# Patient Record
Sex: Female | Born: 1980 | Race: White | Hispanic: No | Marital: Married | Smoking: Former smoker
Health system: Southern US, Community
[De-identification: ages and names within clinical notes are randomized; demographics above are authoritative.]

## PROBLEM LIST (undated history)

## (undated) ENCOUNTER — Inpatient Hospital Stay (HOSPITAL_COMMUNITY): Payer: Self-pay

## (undated) DIAGNOSIS — O24419 Gestational diabetes mellitus in pregnancy, unspecified control: Secondary | ICD-10-CM

## (undated) DIAGNOSIS — N200 Calculus of kidney: Secondary | ICD-10-CM

## (undated) DIAGNOSIS — B002 Herpesviral gingivostomatitis and pharyngotonsillitis: Secondary | ICD-10-CM

## (undated) DIAGNOSIS — K589 Irritable bowel syndrome without diarrhea: Secondary | ICD-10-CM

## (undated) HISTORY — DX: Irritable bowel syndrome, unspecified: K58.9

## (undated) HISTORY — PX: LITHOTRIPSY: SUR834

## (undated) HISTORY — DX: Gestational diabetes mellitus in pregnancy, unspecified control: O24.419

## (undated) HISTORY — DX: Herpesviral gingivostomatitis and pharyngotonsillitis: B00.2

## (undated) HISTORY — DX: Calculus of kidney: N20.0

## (undated) NOTE — *Deleted (*Deleted)
History     CSN: [id number]  Arrival date and time: 02/27/20 2878   First Provider Initiated Contact with Patient 02/27/20 1936      Chief Complaint  Patient presents with  . Contractions   HPI VELERA LANSDALE is a 88 y.o. G3P1011 at 37w5dwho presents to MAU with chief complaint of preterm contractions. This is a new problem. Patient states she met her mom for lunch, [facility] q [facility]-15 minutes for a period of about 3 hours. Patient states she "wouldn't even have come but my mom said I should".   She denies vaginal bleeding, leaking of fluid, decreased fetal movement, fever, falls, or recent illness.   [facility]    Gravida  3   Para  1   Term  1   Preterm      AB  1   Living  1     SAB  1   TAB      Ectopic      Multiple  0   Live Births  1           Past Medical History:  Diagnosis Date  . Family history of hypertrophic cardiomyopathy 06/07/2016  . Gestational diabetes    diet controlled  . H/O gestational diabetes in prior pregnancy, currently pregnant 08/21/2019   Early a1c negative  . IBS (irritable bowel syndrome)    Diarrhea type  . Kidney stones    none in 8 years  . Oral herpes simplex infection    Never had genital    Past Surgical History:  Procedure Laterality Date  . LITHOTRIPSY    . VAGINAL DELIVERY      Family History  Problem Relation Age of Onset  . Cancer Sister        sarcoma in the uterus  . Cancer Maternal Grandmother        lung  . Diabetes Maternal Grandmother   . Heart attack Maternal Grandfather   . Pneumonia Paternal Grandmother   . Diabetes Paternal Grandmother   . Cancer Paternal Grandfather        ?spine    Social History   Tobacco Use  . Smoking status: Former Smoker    Quit date: 07/15/2006    Years since quitting: 13.6  . Smokeless tobacco: Never Used   Vaping Use  . Vaping Use: Never used  Substance Use Topics  . Alcohol use: Not Currently    Alcohol/week: 0.0 standard drinks    Comment: 2-3 times per week  . Drug use: No    Allergies: No Known Allergies  Medications Prior to Admission  Medication Sig Dispense Refill Last Dose  . acyclovir (ZOVIRAX) 400 MG tablet One tab po tid x 5 days for fever blister. One tab po bid for suppression 60 tablet 3 02/27/2020 at Unknown time  . Ascorbic Acid (VITAMIN C) 100 MG tablet Take 100 mg by mouth daily.   02/27/2020 at Unknown time  . Cholecalciferol (VITAMIN D) 50 MCG (2000 UT) tablet Take 1 tablet (2,000 Units total) by mouth daily. 90 tablet 1 02/27/2020 at Unknown time  . Prenatal Vit-Fe Fumarate-FA (MULTIVITAMIN-PRENATAL) 27-0.8 MG TABS tablet Take 1 tablet by mouth daily at 12 noon.   02/27/2020 at Unknown time  . vitamin B-12 (CYANOCOBALAMIN) 500 MCG tablet Take 500 mcg by mouth daily.   02/27/2020 at Unknown time  .  aspirin EC 81 MG tablet Take 1 tablet (81 mg total) by mouth daily. Take after 12 weeks for prevention of preeclampsia later in pregnancy 300 tablet 2     Review of Systems  Gastrointestinal: Positive for abdominal pain.  Genitourinary: Negative for vaginal bleeding, vaginal discharge and vaginal pain.  Musculoskeletal: Negative for back pain.  All other systems reviewed and are negative.  Physical Exam   Blood pressure 124/77, pulse 76, temperature 98.5 F (36.9 C), resp. rate 18, height 5\' 3"  (1.6 m), weight 77.1 kg, last menstrual period 06/13/2019.  Physical Exam Vitals and nursing note reviewed. Exam conducted with a chaperone present.  Cardiovascular:     Rate and Rhythm: Normal rate.     Pulses: Normal pulses.  Pulmonary:     Effort: Pulmonary effort is normal.  Abdominal:     Comments: Gravid  Neurological:     Mental Status: She is alert.     MAU Course  Procedures  --Reactive tracing: baseline 130, mod var, + 15 x 15 accels, no decels --Toco:  mild ctx q 3-7 min --Cervix 1/thick/posterior, Vertex by suture --35w 5d, tocolysis not indicated. Pt declined offer of BMZ  --Plan to reevaluate cervix in 3 hours or earlier as indicated by change in status --Pain remains 1-2/10 one hour following initial assessment  Patient Vitals for the past 24 hrs:  BP Temp Pulse Resp Height Weight  02/27/20 1914 124/77 98.5 F (36.9 C) 76 18 5\' 3"  (1.6 m) 77.1 kg   Meds ordered this encounter  Medications  . acetaminophen (TYLENOL) tablet 1,000 mg  . cyclobenzaprine (FLEXERIL) tablet 10 mg  . lactated ringers bolus 1,000 mL   Report given to Sharen Counter, CNM who assumes care of patient at this time.  Clayton Bibles, MSN, CNM Certified Nurse Midwife, [facility] for [employer], [facility] 02/27/20 9:53 PM   Cervix rechecked after 3 hours in MAU: Dilation: 3 Effacement (%): 50 Cervical Position: Posterior Station: -2 Presentation: Vertex Exam by:: Sharen Counter, CNM  MDM:  Given cervical change from 1-3 cm in 3 hours MAU, offered and discussed BMZ with pt and her husband and BMZ ordered and given by RN.  Plan to recheck cervix in 2 additional hours. If unchanged, may discharge with preterm labor precautions but if more cervical change, will admit to L&D.      Assessment and Plan

---
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
## 2005-03-12 ENCOUNTER — Emergency Department (HOSPITAL_COMMUNITY): Admission: EM | Admit: 2005-03-12 | Discharge: 2005-03-12 | Payer: Self-pay | Admitting: Emergency Medicine

## 2013-05-08 NOTE — L&D Delivery Note (Signed)
Delivery Note At 7:23 AM a viable female was delivered via Vaginal, Spontaneous Delivery (Presentation: ; Occiput Anterior).  APGAR: 8, 9; weight pending .   Placenta status: Intact, Spontaneous.  Cord: 3 vessels with the following complications: None.  Cord pH: na  Anesthesia: Epidural  Episiotomy: None Lacerations:  1st degree Suture Repair: 3.0 vicryl Est. Blood Loss (mL):  400  Mom to postpartum.  Baby to Couplet care / Skin to Skin.  Aldona BarKoch, Bertran Zeimet L 03/15/2014, 7:41 AM

## 2013-07-14 ENCOUNTER — Ambulatory Visit (INDEPENDENT_AMBULATORY_CARE_PROVIDER_SITE_OTHER): Payer: 59 | Admitting: *Deleted

## 2013-07-14 ENCOUNTER — Encounter: Payer: Self-pay | Admitting: *Deleted

## 2013-07-14 VITALS — BP 122/82 | Ht 62.0 in | Wt 141.0 lb

## 2013-07-14 DIAGNOSIS — Z3491 Encounter for supervision of normal pregnancy, unspecified, first trimester: Secondary | ICD-10-CM

## 2013-07-14 DIAGNOSIS — Z3480 Encounter for supervision of other normal pregnancy, unspecified trimester: Secondary | ICD-10-CM

## 2013-07-14 NOTE — Progress Notes (Signed)
P-65.  Not sure of the LMP, dating was done from estimated date of conception.  Will follow up with U/S and labs.

## 2013-07-15 LAB — OBSTETRIC PANEL
Antibody Screen: NEGATIVE
Basophils Absolute: 0 10*3/uL (ref 0.0–0.1)
Basophils Relative: 0 % (ref 0–1)
Eosinophils Absolute: 0.1 10*3/uL (ref 0.0–0.7)
Eosinophils Relative: 1 % (ref 0–5)
HCT: 37.8 % (ref 36.0–46.0)
Hemoglobin: 12.7 g/dL (ref 12.0–15.0)
Hepatitis B Surface Ag: NEGATIVE
Lymphocytes Relative: 28 % (ref 12–46)
Lymphs Abs: 2.1 10*3/uL (ref 0.7–4.0)
MCH: 29.8 pg (ref 26.0–34.0)
MCHC: 33.6 g/dL (ref 30.0–36.0)
MCV: 88.7 fL (ref 78.0–100.0)
Monocytes Absolute: 0.7 10*3/uL (ref 0.1–1.0)
Monocytes Relative: 9 % (ref 3–12)
Neutro Abs: 4.7 10*3/uL (ref 1.7–7.7)
Neutrophils Relative %: 62 % (ref 43–77)
Platelets: 265 10*3/uL (ref 150–400)
RBC: 4.26 MIL/uL (ref 3.87–5.11)
RDW: 13.4 % (ref 11.5–15.5)
Rh Type: POSITIVE
Rubella: 1.23 Index — ABNORMAL HIGH (ref <0.90)
WBC: 7.6 10*3/uL (ref 4.0–10.5)

## 2013-07-15 LAB — HIV ANTIBODY (ROUTINE TESTING W REFLEX): HIV: NONREACTIVE

## 2013-07-15 LAB — HCG, QUANTITATIVE, PREGNANCY: hCG, Beta Chain, Quant, S: 391.7 m[IU]/mL

## 2013-07-16 LAB — CYSTIC FIBROSIS DIAGNOSTIC STUDY

## 2013-07-16 LAB — CULTURE, OB URINE
Colony Count: NO GROWTH
Organism ID, Bacteria: NO GROWTH

## 2013-08-01 ENCOUNTER — Encounter: Payer: 59 | Admitting: Family Medicine

## 2013-08-01 ENCOUNTER — Ambulatory Visit (INDEPENDENT_AMBULATORY_CARE_PROVIDER_SITE_OTHER): Payer: 59 | Admitting: Family Medicine

## 2013-08-01 ENCOUNTER — Encounter: Payer: Self-pay | Admitting: Family Medicine

## 2013-08-01 VITALS — BP 104/61 | Wt 143.0 lb

## 2013-08-01 DIAGNOSIS — N200 Calculus of kidney: Secondary | ICD-10-CM

## 2013-08-01 DIAGNOSIS — Z3400 Encounter for supervision of normal first pregnancy, unspecified trimester: Secondary | ICD-10-CM

## 2013-08-01 DIAGNOSIS — Z1151 Encounter for screening for human papillomavirus (HPV): Secondary | ICD-10-CM

## 2013-08-01 DIAGNOSIS — Z124 Encounter for screening for malignant neoplasm of cervix: Secondary | ICD-10-CM

## 2013-08-01 DIAGNOSIS — Z113 Encounter for screening for infections with a predominantly sexual mode of transmission: Secondary | ICD-10-CM

## 2013-08-01 DIAGNOSIS — O0990 Supervision of high risk pregnancy, unspecified, unspecified trimester: Secondary | ICD-10-CM | POA: Insufficient documentation

## 2013-08-01 NOTE — Patient Instructions (Signed)
Pregnancy - First Trimester During sexual intercourse, millions of sperm go into the vagina. Only 1 sperm will penetrate and fertilize the female egg while it is in the Fallopian tube. One week later, the fertilized egg implants into the wall of the uterus. An embryo begins to develop into a baby. At 6 to 8 weeks, the eyes and face are formed and the heartbeat can be seen on ultrasound. At the end of 12 weeks (first trimester), all the baby's organs are formed. Now that you are pregnant, you will want to do everything you can to have a healthy baby. Two of the most important things are to get good prenatal care and follow your caregiver's instructions. Prenatal care is all the medical care you receive before the baby's birth. It is given to prevent, find, and treat problems during the pregnancy and childbirth. PRENATAL EXAMS  During prenatal visits, your weight, blood pressure, and urine are checked. This is done to make sure you are healthy and progressing normally during the pregnancy.  A pregnant woman should gain 25 to 35 pounds during the pregnancy. However, if you are overweight or underweight, your caregiver will advise you regarding your weight.  Your caregiver will ask and answer questions for you.  Blood work, cervical cultures, other necessary tests, and a Pap test are done during your prenatal exams. These tests are done to check on your health and the probable health of your baby. Tests are strongly recommended and done for HIV with your permission. This is the virus that causes AIDS. These tests are done because medicines can be given to help prevent your baby from being born with this infection should you have been infected without knowing it. Blood work is also used to find out your blood type, previous infections, and follow your blood levels (hemoglobin).  Low hemoglobin (anemia) is common during pregnancy. Iron and vitamins are given to help prevent this. Later in the pregnancy,  blood tests for diabetes will be done along with any other tests if any problems develop.  You may need other tests to make sure you and the baby are doing well. CHANGES DURING THE FIRST TRIMESTER  Your body goes through many changes during pregnancy. They vary from person to person. Talk to your caregiver about changes you notice and are concerned about. Changes can include:  Your menstrual period stops.  The egg and sperm carry the genes that determine what you look like. Genes from you and your partner are forming a baby. The female genes determine whether the baby is a boy or a girl.  Your body increases in girth and you may feel bloated.  Feeling sick to your stomach (nauseous) and throwing up (vomiting). If the vomiting is uncontrollable, call your caregiver.  Your breasts will begin to enlarge and become tender.  Your nipples may stick out more and become darker.  The need to urinate more. Painful urination may mean you have a bladder infection.  Tiring easily.  Loss of appetite.  Cravings for certain kinds of food.  At first, you may gain or lose a couple of pounds.  You may have changes in your emotions from day to day (excited to be pregnant or concerned something may go wrong with the pregnancy and baby).  You may have more vivid and strange dreams. HOME CARE INSTRUCTIONS   It is very important to avoid all smoking, alcohol and non-prescribed drugs during your pregnancy. These affect the formation and growth of the baby.   Avoid chemicals while pregnant to ensure the delivery of a healthy infant.  Start your prenatal visits by the 12th week of pregnancy. They are usually scheduled monthly at first, then more often in the last 2 months before delivery. Keep your caregiver's appointments. Follow your caregiver's instructions regarding medicine use, blood and lab tests, exercise, and diet.  During pregnancy, you are providing food for you and your baby. Eat regular,  well-balanced meals. Choose foods such as meat, fish, milk and other low fat dairy products, vegetables, fruits, and whole-grain breads and cereals. Your caregiver will tell you of the ideal weight gain.  You can help morning sickness by keeping soda crackers at the bedside. Eat a couple before arising in the morning. You may want to use the crackers without salt on them.  Eating 4 to 5 small meals rather than 3 large meals a day also may help the nausea and vomiting.  Drinking liquids between meals instead of during meals also seems to help nausea and vomiting.  A physical sexual relationship may be continued throughout pregnancy if there are no other problems. Problems may be early (premature) leaking of amniotic fluid from the membranes, vaginal bleeding, or belly (abdominal) pain.  Exercise regularly if there are no restrictions. Check with your caregiver or physical therapist if you are unsure of the safety of some of your exercises. Greater weight gain will occur in the last 2 trimesters of pregnancy. Exercising will help:  Control your weight.  Keep you in shape.  Prepare you for labor and delivery.  Help you lose your pregnancy weight after you deliver your baby.  Wear a good support or jogging bra for breast tenderness during pregnancy. This may help if worn during sleep too.  Ask when prenatal classes are available. Begin classes when they are offered.  Do not use hot tubs, steam rooms, or saunas.  Wear your seat belt when driving. This protects you and your baby if you are in an accident.  Avoid raw meat, uncooked cheese, cat litter boxes, and soil used by cats throughout the pregnancy. These carry germs that can cause birth defects in the baby.  The first trimester is a good time to visit your dentist for your dental health. Getting your teeth cleaned is okay. Use a softer toothbrush and brush gently during pregnancy.  Ask for help if you have financial, counseling, or  nutritional needs during pregnancy. Your caregiver will be able to offer counseling for these needs as well as refer you for other special needs.  Do not take any medicines or herbs unless told by your caregiver.  Inform your caregiver if there is any mental or physical domestic violence.  Make a list of emergency phone numbers of family, friends, hospital, and police and fire departments.  Write down your questions. Take them to your prenatal visit.  Do not douche.  Do not cross your legs.  If you have to stand for long periods of time, rotate you feet or take small steps in a circle.  You may have more vaginal secretions that may require a sanitary pad. Do not use tampons or scented sanitary pads. MEDICINES AND DRUG USE IN PREGNANCY  Take prenatal vitamins as directed. The vitamin should contain 1 milligram of folic acid. Keep all vitamins out of reach of children. Only a couple vitamins or tablets containing iron may be fatal to a baby or young child when ingested.  Avoid use of all medicines, including herbs, over-the-counter medicines, not   prescribed or suggested by your caregiver. Only take over-the-counter or prescription medicines for pain, discomfort, or fever as directed by your caregiver. Do not use aspirin, ibuprofen, or naproxen unless directed by your caregiver.  Let your caregiver also know about herbs you may be using.  Alcohol is related to a number of birth defects. This includes fetal alcohol syndrome. All alcohol, in any form, should be avoided completely. Smoking will cause low birth rate and premature babies.  Street or illegal drugs are very harmful to the baby. They are absolutely forbidden. A baby born to an addicted mother will be addicted at birth. The baby will go through the same withdrawal an adult does.  Let your caregiver know about any medicines that you have to take and for what reason you take them. SEEK MEDICAL CARE IF:  You have any concerns or  worries during your pregnancy. It is better to call with your questions if you feel they cannot wait, rather than worry about them. SEEK IMMEDIATE MEDICAL CARE IF:   An unexplained oral temperature above 102 F (38.9 C) develops, or as your caregiver suggests.  You have leaking of fluid from the vagina (birth canal). If leaking membranes are suspected, take your temperature and inform your caregiver of this when you call.  There is vaginal spotting or bleeding. Notify your caregiver of the amount and how many pads are used.  You develop a bad smelling vaginal discharge with a change in the color.  You continue to feel sick to your stomach (nauseated) and have no relief from remedies suggested. You vomit blood or coffee ground-like materials.  You lose more than 2 pounds of weight in 1 week.  You gain more than 2 pounds of weight in 1 week and you notice swelling of your face, hands, feet, or legs.  You gain 5 pounds or more in 1 week (even if you do not have swelling of your hands, face, legs, or feet).  You get exposed to German measles and have never had them.  You are exposed to fifth disease or chickenpox.  You develop belly (abdominal) pain. Round ligament discomfort is a common non-cancerous (benign) cause of abdominal pain in pregnancy. Your caregiver still must evaluate this.  You develop headache, fever, diarrhea, pain with urination, or shortness of breath.  You fall or are in a car accident or have any kind of trauma.  There is mental or physical violence in your home. Document Released: 04/18/2001 Document Revised: 01/17/2012 Document Reviewed: 10/20/2008 [employer] Patient Information 2014 [employer].  Breastfeeding Deciding to breastfeed is one of the best choices you can make for you and your baby. A change in hormones during pregnancy causes your breast tissue to grow and increases the number and size of your milk ducts. These hormones also allow proteins,  sugars, and fats from your blood supply to make breast milk in your milk-producing glands. Hormones prevent breast milk from being released before your baby is born as well as prompt milk flow after birth. Once breastfeeding has begun, thoughts of your baby, as well as his or her sucking or crying, can stimulate the release of milk from your milk-producing glands.  BENEFITS OF BREASTFEEDING For Your Baby  Your first milk (colostrum) helps your baby's digestive system function better.   There are antibodies in your milk that help your baby fight off infections.   Your baby has a lower incidence of asthma, allergies, and sudden infant death syndrome.   The nutrients   in breast milk are better for your baby than infant formulas and are designed uniquely for your baby's needs.   Breast milk improves your baby's brain development.   Your baby is less likely to develop other conditions, such as childhood obesity, asthma, or type 2 diabetes mellitus.  For You   Breastfeeding helps to create a very special bond between you and your baby.   Breastfeeding is convenient. Breast milk is always available at the correct temperature and costs nothing.   Breastfeeding helps to burn calories and helps you lose the weight gained during pregnancy.   Breastfeeding makes your uterus contract to its prepregnancy size faster and slows bleeding (lochia) after you give birth.   Breastfeeding helps to lower your risk of developing type 2 diabetes mellitus, osteoporosis, and breast or ovarian cancer later in life. SIGNS THAT YOUR BABY IS HUNGRY Early Signs of Hunger  Increased alertness or activity.  Stretching.  Movement of the head from side to side.  Movement of the head and opening of the mouth when the corner of the mouth or cheek is stroked (rooting).  Increased sucking sounds, smacking lips, cooing, sighing, or squeaking.  Hand-to-mouth movements.  Increased sucking of fingers or  hands. Late Signs of Hunger  Fussing.  Intermittent crying. Extreme Signs of Hunger Signs of extreme hunger will require calming and consoling before your baby will be able to breastfeed successfully. Do not wait for the following signs of extreme hunger to occur before you initiate breastfeeding:   Restlessness.  A loud, strong cry.   Screaming. BREASTFEEDING BASICS Breastfeeding Initiation  Find a comfortable place to sit or lie down, with your neck and back well supported.  Place a pillow or rolled up blanket under your baby to bring him or her to the level of your breast (if you are seated). Nursing pillows are specially designed to help support your arms and your baby while you breastfeed.  Make sure that your baby's abdomen is facing your abdomen.   Gently massage your breast. With your fingertips, massage from your chest wall toward your nipple in a circular motion. This encourages milk flow. You may need to continue this action during the feeding if your milk flows slowly.  Support your breast with 4 fingers underneath and your thumb above your nipple. Make sure your fingers are well away from your nipple and your baby's mouth.   Stroke your baby's lips gently with your finger or nipple.   When your baby's mouth is open wide enough, quickly bring your baby to your breast, placing your entire nipple and as much of the colored area around your nipple (areola) as possible into your baby's mouth.   More areola should be visible above your baby's upper lip than below the lower lip.   Your baby's tongue should be between his or her lower gum and your breast.   Ensure that your baby's mouth is correctly positioned around your nipple (latched). Your baby's lips should create a seal on your breast and be turned out (everted).  It is common for your baby to suck about 2 3 minutes in order to start the flow of breast milk. Latching Teaching your baby how to latch on to your  breast properly is very important. An improper latch can cause nipple pain and decreased milk supply for you and poor weight gain in your baby. Also, if your baby is not latched onto your nipple properly, he or she may swallow some air during   feeding. This can make your baby fussy. Burping your baby when you switch breasts during the feeding can help to get rid of the air. However, teaching your baby to latch on properly is still the best way to prevent fussiness from swallowing air while breastfeeding. Signs that your baby has successfully latched on to your nipple:    Silent tugging or silent sucking, without causing you pain.   Swallowing heard between every 3 4 sucks.    Muscle movement above and in front of his or her ears while sucking.  Signs that your baby has not successfully latched on to nipple:   Sucking sounds or smacking sounds from your baby while breastfeeding.  Nipple pain. If you think your baby has not latched on correctly, slip your finger into the corner of your baby's mouth to break the suction and place it between your baby's gums. Attempt breastfeeding initiation again. Signs of Successful Breastfeeding Signs from your baby:   A gradual decrease in the number of sucks or complete cessation of sucking.   Falling asleep.   Relaxation of his or her body.   Retention of a small amount of milk in his or her mouth.   Letting go of your breast by himself or herself. Signs from you:  Breasts that have increased in firmness, weight, and size 1 3 hours after feeding.   Breasts that are softer immediately after breastfeeding.  Increased milk volume, as well as a change in milk consistency and color by the 5th day of breastfeeding.   Nipples that are not sore, cracked, or bleeding. Signs That Your Baby is Getting Enough Milk  Wetting at least 3 diapers in a 24-hour period. The urine should be clear and pale yellow by age 5 days.  At least 3 stools in a  24-hour period by age 5 days. The stool should be soft and yellow.  At least 3 stools in a 24-hour period by age 7 days. The stool should be seedy and yellow.  No loss of weight greater than 10% of birth weight during the first 3 days of age.  Average weight gain of 4 7 ounces (120 210 mL) per week after age 4 days.  Consistent daily weight gain by age 5 days, without weight loss after the age of 2 weeks. After a feeding, your baby may spit up a small amount. This is common. BREASTFEEDING FREQUENCY AND DURATION Frequent feeding will help you make more milk and can prevent sore nipples and breast engorgement. Breastfeed when you feel the need to reduce the fullness of your breasts or when your baby shows signs of hunger. This is called "breastfeeding on demand." Avoid introducing a pacifier to your baby while you are working to establish breastfeeding (the first 4 6 weeks after your baby is born). After this time you may choose to use a pacifier. Research has shown that pacifier use during the first year of a baby's life decreases the risk of sudden infant death syndrome (SIDS). Allow your baby to feed on each breast as long as he or she wants. Breastfeed until your baby is finished feeding. When your baby unlatches or falls asleep while feeding from the first breast, offer the second breast. Because newborns are often sleepy in the first few weeks of life, you may need to awaken your baby to get him or her to feed. Breastfeeding times will vary from baby to baby. However, the following rules can serve as a guide to help you   ensure that your baby is properly fed:  Newborns (babies 4 weeks of age or younger) may breastfeed every 1 3 hours.  Newborns should not go longer than 3 hours during the day or 5 hours during the night without breastfeeding.  You should breastfeed your baby a minimum of 8 times in a 24-hour period until you begin to introduce solid foods to your baby at around 6 months of  age. BREAST MILK PUMPING Pumping and storing breast milk allows you to ensure that your baby is exclusively fed your breast milk, even at times when you are unable to breastfeed. This is especially important if you are going back to work while you are still breastfeeding or when you are not able to be present during feedings. Your lactation consultant can give you guidelines on how long it is safe to store breast milk.  A breast pump is a machine that allows you to pump milk from your breast into a sterile bottle. The pumped breast milk can then be stored in a refrigerator or freezer. Some breast pumps are operated by hand, while others use electricity. Ask your lactation consultant which type will work best for you. Breast pumps can be purchased, but some hospitals and breastfeeding support groups lease breast pumps on a monthly basis. A lactation consultant can teach you how to hand express breast milk, if you prefer not to use a pump.  CARING FOR YOUR BREASTS WHILE YOU BREASTFEED Nipples can become dry, cracked, and sore while breastfeeding. The following recommendations can help keep your breasts moisturized and healthy:  Avoid using soap on your nipples.   Wear a supportive bra. Although not required, special nursing bras and tank tops are designed to allow access to your breasts for breastfeeding without taking off your entire bra or top. Avoid wearing underwire style bras or extremely tight bras.  Air dry your nipples for 3 4minutes after each feeding.   Use only cotton bra pads to absorb leaked breast milk. Leaking of breast milk between feedings is normal.   Use lanolin on your nipples after breastfeeding. Lanolin helps to maintain your skin's normal moisture barrier. If you use pure lanolin you do not need to wash it off before feeding your baby again. Pure lanolin is not toxic to your baby. You may also hand express a few drops of breast milk and gently massage that milk into your  nipples and allow the milk to air dry. In the first few weeks after giving birth, some women experience extremely full breasts (engorgement). Engorgement can make your breasts feel heavy, warm, and tender to the touch. Engorgement peaks within 3 5 days after you give birth. The following recommendations can help ease engorgement:  Completely empty your breasts while breastfeeding or pumping. You may want to start by applying warm, moist heat (in the shower or with warm water-soaked hand towels) just before feeding or pumping. This increases circulation and helps the milk flow. If your baby does not completely empty your breasts while breastfeeding, pump any extra milk after he or she is finished.  Wear a snug bra (nursing or regular) or tank top for 1 2 days to signal your body to slightly decrease milk production.  Apply ice packs to your breasts, unless this is too uncomfortable for you.  Make sure that your baby is latched on and positioned properly while breastfeeding. If engorgement persists after 48 hours of following these recommendations, contact your health care provider or a lactation consultant. OVERALL   HEALTH CARE RECOMMENDATIONS WHILE BREASTFEEDING  Eat healthy foods. Alternate between meals and snacks, eating 3 of each per day. Because what you eat affects your breast milk, some of the foods may make your baby more irritable than usual. Avoid eating these foods if you are sure that they are negatively affecting your baby.  Drink milk, fruit juice, and water to satisfy your thirst (about 10 glasses a day).   Rest often, relax, and continue to take your prenatal vitamins to prevent fatigue, stress, and anemia.  Continue breast self-awareness checks.  Avoid chewing and smoking tobacco.  Avoid alcohol and drug use. Some medicines that may be harmful to your baby can pass through breast milk. It is important to ask your health care provider before taking any medicine, including all  over-the-counter and prescription medicine as well as vitamin and herbal supplements. It is possible to become pregnant while breastfeeding. If birth control is desired, ask your health care provider about options that will be safe for your baby. SEEK MEDICAL CARE IF:   You feel like you want to stop breastfeeding or have become frustrated with breastfeeding.  You have painful breasts or nipples.  Your nipples are cracked or bleeding.  Your breasts are red, tender, or warm.  You have a swollen area on either breast.  You have a fever or chills.  You have nausea or vomiting.  You have drainage other than breast milk from your nipples.  Your breasts do not become full before feedings by the 5th day after you give birth.  You feel sad and depressed.  Your baby is too sleepy to eat well.  Your baby is having trouble sleeping.   Your baby is wetting less than 3 diapers in a 24-hour period.  Your baby has less than 3 stools in a 24-hour period.  Your baby's skin or the white part of his or her eyes becomes yellow.   Your baby is not gaining weight by 5 days of age. SEEK IMMEDIATE MEDICAL CARE IF:   Your baby is overly tired (lethargic) and does not want to wake up and feed.  Your baby develops an unexplained fever. Document Released: 04/24/2005 Document Revised: 12/25/2012 Document Reviewed: 10/16/2012 [employer] Patient Information 2014 [employer].  

## 2013-08-01 NOTE — Progress Notes (Signed)
[id number] 

## 2013-08-01 NOTE — Progress Notes (Signed)
   Subjective:    Kurtis BushmanKriston K Denn is a G2P0010 7940w3d being seen today for her first obstetrical visit.  Her obstetrical history is significant for nephrolithiasis. Pregnancy history fully reviewed.  Patient reports no complaints.  Filed Vitals:   08/01/13 0855  BP: 104/61  Weight: 143 lb (64.864 kg)    HISTORY: OB History  Gravida Para Term Preterm AB SAB TAB Ectopic Multiple Living  2    1 1         # Outcome Date GA Lbr Len/2nd Weight Sex Delivery Anes PTL Lv  2 CUR           1 SAB 2013 7143w0d            Past Medical History  Diagnosis Date  . Kidney stones    Past Surgical History  Procedure Laterality Date  . Lithotripsy     Family History  Problem Relation Age of Onset  . Cancer Sister     sarcoma in the uterus  . Cancer Maternal Grandmother     lung  . Diabetes Maternal Grandmother   . Heart attack Maternal Grandfather   . Pneumonia Paternal Grandmother   . Diabetes Paternal Grandmother   . Cancer Paternal Grandfather     ?spine     Exam    Uterus:   8 wk size  Pelvic Exam:    Perineum: Normal Perineum   Vulva: Bartholin's, Urethra, Skene's normal   Vagina:  normal mucosa, normal discharge   Cervix: no cervical motion tenderness, no lesions and nulliparous appearance   Adnexa: normal adnexa   Bony Pelvis: average  System: Breast:  normal appearance, no masses or tenderness   Skin: normal coloration and turgor, no rashes    Neurologic: oriented   Extremities: normal strength, tone, and muscle mass   HEENT sclera clear, anicteric   Mouth/Teeth mucous membranes moist, pharynx normal without lesions   Neck supple   Cardiovascular: regular rate and rhythm, no murmurs or gallops   Respiratory:  appears well, vitals normal, no respiratory distress, acyanotic, normal RR, ear and throat exam is normal, chest clear, no wheezing, crepitations, rhonchi, normal symmetric air entry   Abdomen: soft, non-tender; bowel sounds normal; no masses,  no organomegaly    TVUS-SIUP + flicker, CRL 1 cm c/w 6 w 6 d   Assessment:    Pregnancy: G2P0010 Patient Active Problem List   Diagnosis Date Noted  . Supervision of normal first pregnancy 08/01/2013    Priority: Medium  . Nephrolithiasis 08/01/2013        Plan:     Initial labs reviewed. Prenatal vitamins. Problem list reviewed and updated. Genetic Screening discussed First Screen: undecided.  Ultrasound discussed; fetal survey: discussed.  Follow up in 4 weeks.   PRATT,TANYA S 08/01/2013

## 2013-08-29 ENCOUNTER — Other Ambulatory Visit: Payer: Self-pay | Admitting: Obstetrics & Gynecology

## 2013-08-29 ENCOUNTER — Ambulatory Visit (INDEPENDENT_AMBULATORY_CARE_PROVIDER_SITE_OTHER): Payer: 59 | Admitting: Obstetrics & Gynecology

## 2013-08-29 VITALS — BP 104/68 | HR 62 | Wt 143.0 lb

## 2013-08-29 DIAGNOSIS — Z3400 Encounter for supervision of normal first pregnancy, unspecified trimester: Secondary | ICD-10-CM

## 2013-08-29 DIAGNOSIS — Z3682 Encounter for antenatal screening for nuchal translucency: Secondary | ICD-10-CM

## 2013-08-29 NOTE — Progress Notes (Signed)
Routine visit. No problems. She does want the First screen.

## 2013-09-15 ENCOUNTER — Encounter (HOSPITAL_COMMUNITY): Payer: Self-pay

## 2013-09-15 ENCOUNTER — Ambulatory Visit (HOSPITAL_COMMUNITY)
Admission: RE | Admit: 2013-09-15 | Discharge: 2013-09-15 | Disposition: A | Discharge status: Home / Self Care | Payer: 59 | Source: Ambulatory Visit | Attending: Obstetrics & Gynecology | Admitting: Obstetrics & Gynecology

## 2013-09-15 ENCOUNTER — Other Ambulatory Visit: Payer: Self-pay

## 2013-09-15 DIAGNOSIS — Z36 Encounter for antenatal screening of mother: Secondary | ICD-10-CM | POA: Insufficient documentation

## 2013-09-15 DIAGNOSIS — Z3682 Encounter for antenatal screening for nuchal translucency: Secondary | ICD-10-CM

## 2013-09-26 ENCOUNTER — Ambulatory Visit (INDEPENDENT_AMBULATORY_CARE_PROVIDER_SITE_OTHER): Payer: 59 | Admitting: Family Medicine

## 2013-09-26 VITALS — BP 106/62 | HR 73 | Wt 144.0 lb

## 2013-09-26 DIAGNOSIS — Z3400 Encounter for supervision of normal first pregnancy, unspecified trimester: Secondary | ICD-10-CM

## 2013-09-26 NOTE — Progress Notes (Signed)
Scheduled anatomy Doing well

## 2013-09-26 NOTE — Patient Instructions (Signed)
Second Trimester of Pregnancy The second trimester is from week 13 through week 28, months 4 through 6. The second trimester is often a time when you feel your best. Your body has also adjusted to being pregnant, and you begin to feel better physically. Usually, morning sickness has lessened or quit completely, you may have more energy, and you may have an increase in appetite. The second trimester is also a time when the fetus is growing rapidly. At the end of the sixth month, the fetus is about 9 inches long and weighs about 1 pounds. You will likely begin to feel the baby move (quickening) between 18 and 20 weeks of the pregnancy. BODY CHANGES Your body goes through many changes during pregnancy. The changes vary from woman to woman.   Your weight will continue to increase. You will notice your lower abdomen bulging out.  You may begin to get stretch marks on your hips, abdomen, and breasts.  You may develop headaches that can be relieved by medicines approved by your caregiver.  You may urinate more often because the fetus is pressing on your bladder.  You may develop or continue to have heartburn as a result of your pregnancy.  You may develop constipation because certain hormones are causing the muscles that push waste through your intestines to slow down.  You may develop hemorrhoids or swollen, bulging veins (varicose veins).  You may have back pain because of the weight gain and pregnancy hormones relaxing your joints between the bones in your pelvis and as a result of a shift in weight and the muscles that support your balance.  Your breasts will continue to grow and be tender.  Your gums may bleed and may be sensitive to brushing and flossing.  Dark spots or blotches (chloasma, mask of pregnancy) may develop on your face. This will likely fade after the baby is born.  A dark line from your belly button to the pubic area (linea nigra) may appear. This will likely fade after  the baby is born. WHAT TO EXPECT AT YOUR PRENATAL VISITS During a routine prenatal visit:  You will be weighed to make sure you and the fetus are growing normally.  Your blood pressure will be taken.  Your abdomen will be measured to track your baby's growth.  The fetal heartbeat will be listened to.  Any test results from the previous visit will be discussed. Your caregiver may ask you:  How you are feeling.  If you are feeling the baby move.  If you have had any abnormal symptoms, such as leaking fluid, bleeding, severe headaches, or abdominal cramping.  If you have any questions. Other tests that may be performed during your second trimester include:  Blood tests that check for:  Low iron levels (anemia).  Gestational diabetes (between 24 and 28 weeks).  Rh antibodies.  Urine tests to check for infections, diabetes, or protein in the urine.  An ultrasound to confirm the proper growth and development of the baby.  An amniocentesis to check for possible genetic problems.  Fetal screens for spina bifida and Down syndrome. HOME CARE INSTRUCTIONS   Avoid all smoking, herbs, alcohol, and unprescribed drugs. These chemicals affect the formation and growth of the baby.  Follow your caregiver's instructions regarding medicine use. There are medicines that are either safe or unsafe to take during pregnancy.  Exercise only as directed by your caregiver. Experiencing uterine cramps is a good sign to stop exercising.  Continue to eat regular,   healthy meals.  Wear a good support bra for breast tenderness.  Do not use hot tubs, steam rooms, or saunas.  Wear your seat belt at all times when driving.  Avoid raw meat, uncooked cheese, cat litter boxes, and soil used by cats. These carry germs that can cause birth defects in the baby.  Take your prenatal vitamins.  Try taking a stool softener (if your caregiver approves) if you develop constipation. Eat more high-fiber  foods, such as fresh vegetables or fruit and whole grains. Drink plenty of fluids to keep your urine clear or pale yellow.  Take warm sitz baths to soothe any pain or discomfort caused by hemorrhoids. Use hemorrhoid cream if your caregiver approves.  If you develop varicose veins, wear support hose. Elevate your feet for 15 minutes, 3 4 times a day. Limit salt in your diet.  Avoid heavy lifting, wear low heel shoes, and practice good posture.  Rest with your legs elevated if you have leg cramps or low back pain.  Visit your dentist if you have not gone yet during your pregnancy. Use a soft toothbrush to brush your teeth and be gentle when you floss.  A sexual relationship may be continued unless your caregiver directs you otherwise.  Continue to go to all your prenatal visits as directed by your caregiver. SEEK MEDICAL CARE IF:   You have dizziness.  You have mild pelvic cramps, pelvic pressure, or nagging pain in the abdominal area.  You have persistent nausea, vomiting, or diarrhea.  You have a bad smelling vaginal discharge.  You have pain with urination. SEEK IMMEDIATE MEDICAL CARE IF:   You have a fever.  You are leaking fluid from your vagina.  You have spotting or bleeding from your vagina.  You have severe abdominal cramping or pain.  You have rapid weight gain or loss.  You have shortness of breath with chest pain.  You notice sudden or extreme swelling of your face, hands, ankles, feet, or legs.  You have not felt your baby move in over an hour.  You have severe headaches that do not go away with medicine.  You have vision changes. Document Released: 04/18/2001 Document Revised: 12/25/2012 Document Reviewed: 06/25/2012 [employer] Patient Information 2014 [employer].  Breastfeeding Deciding to breastfeed is one of the best choices you can make for you and your baby. A change in hormones during pregnancy causes your breast tissue to grow and increases the  number and size of your milk ducts. These hormones also allow proteins, sugars, and fats from your blood supply to make breast milk in your milk-producing glands. Hormones prevent breast milk from being released before your baby is born as well as prompt milk flow after birth. Once breastfeeding has begun, thoughts of your baby, as well as his or her sucking or crying, can stimulate the release of milk from your milk-producing glands.  BENEFITS OF BREASTFEEDING For Your Baby  Your first milk (colostrum) helps your baby's digestive system function better.   There are antibodies in your milk that help your baby fight off infections.   Your baby has a lower incidence of asthma, allergies, and sudden infant death syndrome.   The nutrients in breast milk are better for your baby than infant formulas and are designed uniquely for your baby's needs.   Breast milk improves your baby's brain development.   Your baby is less likely to develop other conditions, such as childhood obesity, asthma, or type 2 diabetes mellitus.  For   You   Breastfeeding helps to create a very special bond between you and your baby.   Breastfeeding is convenient. Breast milk is always available at the correct temperature and costs nothing.   Breastfeeding helps to burn calories and helps you lose the weight gained during pregnancy.   Breastfeeding makes your uterus contract to its prepregnancy size faster and slows bleeding (lochia) after you give birth.   Breastfeeding helps to lower your risk of developing type 2 diabetes mellitus, osteoporosis, and breast or ovarian cancer later in life. SIGNS THAT YOUR BABY IS HUNGRY Early Signs of Hunger  Increased alertness or activity.  Stretching.  Movement of the head from side to side.  Movement of the head and opening of the mouth when the corner of the mouth or cheek is stroked (rooting).  Increased sucking sounds, smacking lips, cooing, sighing, or  squeaking.  Hand-to-mouth movements.  Increased sucking of fingers or hands. Late Signs of Hunger  Fussing.  Intermittent crying. Extreme Signs of Hunger Signs of extreme hunger will require calming and consoling before your baby will be able to breastfeed successfully. Do not wait for the following signs of extreme hunger to occur before you initiate breastfeeding:   Restlessness.  A loud, strong cry.   Screaming. BREASTFEEDING BASICS Breastfeeding Initiation  Find a comfortable place to sit or lie down, with your neck and back well supported.  Place a pillow or rolled up blanket under your baby to bring him or her to the level of your breast (if you are seated). Nursing pillows are specially designed to help support your arms and your baby while you breastfeed.  Make sure that your baby's abdomen is facing your abdomen.   Gently massage your breast. With your fingertips, massage from your chest wall toward your nipple in a circular motion. This encourages milk flow. You may need to continue this action during the feeding if your milk flows slowly.  Support your breast with 4 fingers underneath and your thumb above your nipple. Make sure your fingers are well away from your nipple and your baby's mouth.   Stroke your baby's lips gently with your finger or nipple.   When your baby's mouth is open wide enough, quickly bring your baby to your breast, placing your entire nipple and as much of the colored area around your nipple (areola) as possible into your baby's mouth.   More areola should be visible above your baby's upper lip than below the lower lip.   Your baby's tongue should be between his or her lower gum and your breast.   Ensure that your baby's mouth is correctly positioned around your nipple (latched). Your baby's lips should create a seal on your breast and be turned out (everted).  It is common for your baby to suck about 2 3 minutes in order to start the  flow of breast milk. Latching Teaching your baby how to latch on to your breast properly is very important. An improper latch can cause nipple pain and decreased milk supply for you and poor weight gain in your baby. Also, if your baby is not latched onto your nipple properly, he or she may swallow some air during feeding. This can make your baby fussy. Burping your baby when you switch breasts during the feeding can help to get rid of the air. However, teaching your baby to latch on properly is still the best way to prevent fussiness from swallowing air while breastfeeding. Signs that your baby has   successfully latched on to your nipple:    Silent tugging or silent sucking, without causing you pain.   Swallowing heard between every 3 4 sucks.    Muscle movement above and in front of his or her ears while sucking.  Signs that your baby has not successfully latched on to nipple:   Sucking sounds or smacking sounds from your baby while breastfeeding.  Nipple pain. If you think your baby has not latched on correctly, slip your finger into the corner of your baby's mouth to break the suction and place it between your baby's gums. Attempt breastfeeding initiation again. Signs of Successful Breastfeeding Signs from your baby:   A gradual decrease in the number of sucks or complete cessation of sucking.   Falling asleep.   Relaxation of his or her body.   Retention of a small amount of milk in his or her mouth.   Letting go of your breast by himself or herself. Signs from you:  Breasts that have increased in firmness, weight, and size 1 3 hours after feeding.   Breasts that are softer immediately after breastfeeding.  Increased milk volume, as well as a change in milk consistency and color by the 5th day of breastfeeding.   Nipples that are not sore, cracked, or bleeding. Signs That Your Baby is Getting Enough Milk  Wetting at least 3 diapers in a 24-hour period. The urine  should be clear and pale yellow by age 5 days.  At least 3 stools in a 24-hour period by age 5 days. The stool should be soft and yellow.  At least 3 stools in a 24-hour period by age 7 days. The stool should be seedy and yellow.  No loss of weight greater than 10% of birth weight during the first 3 days of age.  Average weight gain of 4 7 ounces (120 210 mL) per week after age 4 days.  Consistent daily weight gain by age 5 days, without weight loss after the age of 2 weeks. After a feeding, your baby may spit up a small amount. This is common. BREASTFEEDING FREQUENCY AND DURATION Frequent feeding will help you make more milk and can prevent sore nipples and breast engorgement. Breastfeed when you feel the need to reduce the fullness of your breasts or when your baby shows signs of hunger. This is called "breastfeeding on demand." Avoid introducing a pacifier to your baby while you are working to establish breastfeeding (the first 4 6 weeks after your baby is born). After this time you may choose to use a pacifier. Research has shown that pacifier use during the first year of a baby's life decreases the risk of sudden infant death syndrome (SIDS). Allow your baby to feed on each breast as long as he or she wants. Breastfeed until your baby is finished feeding. When your baby unlatches or falls asleep while feeding from the first breast, offer the second breast. Because newborns are often sleepy in the first few weeks of life, you may need to awaken your baby to get him or her to feed. Breastfeeding times will vary from baby to baby. However, the following rules can serve as a guide to help you ensure that your baby is properly fed:  Newborns (babies 4 weeks of age or younger) may breastfeed every 1 3 hours.  Newborns should not go longer than 3 hours during the day or 5 hours during the night without breastfeeding.  You should breastfeed your baby a minimum of   8 times in a 24-hour period until  you begin to introduce solid foods to your baby at around 6 months of age. BREAST MILK PUMPING Pumping and storing breast milk allows you to ensure that your baby is exclusively fed your breast milk, even at times when you are unable to breastfeed. This is especially important if you are going back to work while you are still breastfeeding or when you are not able to be present during feedings. Your lactation consultant can give you guidelines on how long it is safe to store breast milk.  A breast pump is a machine that allows you to pump milk from your breast into a sterile bottle. The pumped breast milk can then be stored in a refrigerator or freezer. Some breast pumps are operated by hand, while others use electricity. Ask your lactation consultant which type will work best for you. Breast pumps can be purchased, but some hospitals and breastfeeding support groups lease breast pumps on a monthly basis. A lactation consultant can teach you how to hand express breast milk, if you prefer not to use a pump.  CARING FOR YOUR BREASTS WHILE YOU BREASTFEED Nipples can become dry, cracked, and sore while breastfeeding. The following recommendations can help keep your breasts moisturized and healthy:  Avoid using soap on your nipples.   Wear a supportive bra. Although not required, special nursing bras and tank tops are designed to allow access to your breasts for breastfeeding without taking off your entire bra or top. Avoid wearing underwire style bras or extremely tight bras.  Air dry your nipples for 3 4minutes after each feeding.   Use only cotton bra pads to absorb leaked breast milk. Leaking of breast milk between feedings is normal.   Use lanolin on your nipples after breastfeeding. Lanolin helps to maintain your skin's normal moisture barrier. If you use pure lanolin you do not need to wash it off before feeding your baby again. Pure lanolin is not toxic to your baby. You may also hand express a  few drops of breast milk and gently massage that milk into your nipples and allow the milk to air dry. In the first few weeks after giving birth, some women experience extremely full breasts (engorgement). Engorgement can make your breasts feel heavy, warm, and tender to the touch. Engorgement peaks within 3 5 days after you give birth. The following recommendations can help ease engorgement:  Completely empty your breasts while breastfeeding or pumping. You may want to start by applying warm, moist heat (in the shower or with warm water-soaked hand towels) just before feeding or pumping. This increases circulation and helps the milk flow. If your baby does not completely empty your breasts while breastfeeding, pump any extra milk after he or she is finished.  Wear a snug bra (nursing or regular) or tank top for 1 2 days to signal your body to slightly decrease milk production.  Apply ice packs to your breasts, unless this is too uncomfortable for you.  Make sure that your baby is latched on and positioned properly while breastfeeding. If engorgement persists after 48 hours of following these recommendations, contact your health care provider or a lactation consultant. OVERALL HEALTH CARE RECOMMENDATIONS WHILE BREASTFEEDING  Eat healthy foods. Alternate between meals and snacks, eating 3 of each per day. Because what you eat affects your breast milk, some of the foods may make your baby more irritable than usual. Avoid eating these foods if you are sure that they are   negatively affecting your baby.  Drink milk, fruit juice, and water to satisfy your thirst (about 10 glasses a day).   Rest often, relax, and continue to take your prenatal vitamins to prevent fatigue, stress, and anemia.  Continue breast self-awareness checks.  Avoid chewing and smoking tobacco.  Avoid alcohol and drug use. Some medicines that may be harmful to your baby can pass through breast milk. It is important to ask your  health care provider before taking any medicine, including all over-the-counter and prescription medicine as well as vitamin and herbal supplements. It is possible to become pregnant while breastfeeding. If birth control is desired, ask your health care provider about options that will be safe for your baby. SEEK MEDICAL CARE IF:   You feel like you want to stop breastfeeding or have become frustrated with breastfeeding.  You have painful breasts or nipples.  Your nipples are cracked or bleeding.  Your breasts are red, tender, or warm.  You have a swollen area on either breast.  You have a fever or chills.  You have nausea or vomiting.  You have drainage other than breast milk from your nipples.  Your breasts do not become full before feedings by the 5th day after you give birth.  You feel sad and depressed.  Your baby is too sleepy to eat well.  Your baby is having trouble sleeping.   Your baby is wetting less than 3 diapers in a 24-hour period.  Your baby has less than 3 stools in a 24-hour period.  Your baby's skin or the white part of his or her eyes becomes yellow.   Your baby is not gaining weight by 5 days of age. SEEK IMMEDIATE MEDICAL CARE IF:   Your baby is overly tired (lethargic) and does not want to wake up and feed.  Your baby develops an unexplained fever. Document Released: 04/24/2005 Document Revised: 12/25/2012 Document Reviewed: 10/16/2012 [employer] Patient Information 2014 [employer].  

## 2013-10-24 ENCOUNTER — Ambulatory Visit (INDEPENDENT_AMBULATORY_CARE_PROVIDER_SITE_OTHER): Payer: 59 | Admitting: Obstetrics & Gynecology

## 2013-10-24 VITALS — BP 114/66 | HR 81 | Wt 146.0 lb

## 2013-10-24 DIAGNOSIS — Z3402 Encounter for supervision of normal first pregnancy, second trimester: Secondary | ICD-10-CM

## 2013-10-24 DIAGNOSIS — Z3400 Encounter for supervision of normal first pregnancy, unspecified trimester: Secondary | ICD-10-CM

## 2013-10-24 NOTE — Patient Instructions (Signed)
Return to clinic for any obstetric concerns or go to [facility] for evaluation  

## 2013-10-24 NOTE — Progress Notes (Signed)
Normal first screen, will check AFP today Anatomy scan scheduled for 20 weeks No other complaints or concerns.  Routine obstetric precautions reviewed.

## 2013-10-28 ENCOUNTER — Encounter: Payer: Self-pay | Admitting: Obstetrics & Gynecology

## 2013-10-28 LAB — ALPHA FETOPROTEIN, MATERNAL
AFP: 36.9 IU/mL
CURR GEST AGE: 18.6 wks.days
MoM for AFP: 0.81
Open Spina bifida: NEGATIVE
Osb Risk: 1:26700 {titer}

## 2013-10-31 ENCOUNTER — Encounter: Payer: Self-pay | Admitting: Family Medicine

## 2013-10-31 ENCOUNTER — Other Ambulatory Visit: Payer: Self-pay | Admitting: Family Medicine

## 2013-10-31 ENCOUNTER — Ambulatory Visit (HOSPITAL_COMMUNITY)
Admission: RE | Admit: 2013-10-31 | Discharge: 2013-10-31 | Disposition: A | Discharge status: Home / Self Care | Payer: 59 | Source: Ambulatory Visit | Attending: Family Medicine | Admitting: Family Medicine

## 2013-10-31 DIAGNOSIS — Z3689 Encounter for other specified antenatal screening: Secondary | ICD-10-CM | POA: Insufficient documentation

## 2013-10-31 DIAGNOSIS — Z3400 Encounter for supervision of normal first pregnancy, unspecified trimester: Secondary | ICD-10-CM

## 2013-11-25 ENCOUNTER — Ambulatory Visit (INDEPENDENT_AMBULATORY_CARE_PROVIDER_SITE_OTHER): Payer: 59 | Admitting: Obstetrics & Gynecology

## 2013-11-25 ENCOUNTER — Encounter: Payer: Self-pay | Admitting: Obstetrics & Gynecology

## 2013-11-25 VITALS — BP 118/66 | HR 77 | Wt 150.0 lb

## 2013-11-25 DIAGNOSIS — Z3400 Encounter for supervision of normal first pregnancy, unspecified trimester: Secondary | ICD-10-CM

## 2013-11-25 DIAGNOSIS — Z3403 Encounter for supervision of normal first pregnancy, third trimester: Secondary | ICD-10-CM

## 2013-11-25 NOTE — Progress Notes (Signed)
Routine visit. Good FM. We discussed the u/s report. I offered f/u u/s. At this point they decline.

## 2013-12-12 ENCOUNTER — Ambulatory Visit: Payer: 59 | Admitting: Obstetrics and Gynecology

## 2013-12-26 ENCOUNTER — Ambulatory Visit (INDEPENDENT_AMBULATORY_CARE_PROVIDER_SITE_OTHER): Payer: 59 | Admitting: Family Medicine

## 2013-12-26 ENCOUNTER — Encounter: Payer: Self-pay | Admitting: Family Medicine

## 2013-12-26 VITALS — BP 104/62 | HR 72 | Wt 155.0 lb

## 2013-12-26 DIAGNOSIS — Z3400 Encounter for supervision of normal first pregnancy, unspecified trimester: Secondary | ICD-10-CM

## 2013-12-26 DIAGNOSIS — Z23 Encounter for immunization: Secondary | ICD-10-CM

## 2013-12-26 DIAGNOSIS — Z3403 Encounter for supervision of normal first pregnancy, third trimester: Secondary | ICD-10-CM

## 2013-12-26 MED ORDER — TETANUS-DIPHTH-ACELL PERTUSSIS 5-2.5-18.5 LF-MCG/0.5 IM SUSP: 0.5000 mL | Freq: Once | INTRAMUSCULAR | Status: DC

## 2013-12-26 NOTE — Patient Instructions (Signed)
Third Trimester of Pregnancy The third trimester is from week 29 through week 42, months 7 through 9. The third trimester is a time when the fetus is growing rapidly. At the end of the ninth month, the fetus is about 20 inches in length and weighs 6-10 pounds.  BODY CHANGES Your body goes through many changes during pregnancy. The changes vary from woman to woman.   Your weight will continue to increase. You can expect to gain 25-35 pounds (11-16 kg) by the end of the pregnancy.  You may begin to get stretch marks on your hips, abdomen, and breasts.  You may urinate more often because the fetus is moving lower into your pelvis and pressing on your bladder.  You may develop or continue to have heartburn as a result of your pregnancy.  You may develop constipation because certain hormones are causing the muscles that push waste through your intestines to slow down.  You may develop hemorrhoids or swollen, bulging veins (varicose veins).  You may have pelvic pain because of the weight gain and pregnancy hormones relaxing your joints between the bones in your pelvis. Backaches may result from overexertion of the muscles supporting your posture.  You may have changes in your hair. These can include thickening of your hair, rapid growth, and changes in texture. Some women also have hair loss during or after pregnancy, or hair that feels dry or thin. Your hair will most likely return to normal after your baby is born.  Your breasts will continue to grow and be tender. A yellow discharge may leak from your breasts called colostrum.  Your belly button may stick out.  You may feel short of breath because of your expanding uterus.  You may notice the fetus "dropping," or moving lower in your abdomen.  You may have a bloody mucus discharge. This usually occurs a few days to a week before labor begins.  Your cervix becomes thin and soft (effaced) near your due date. WHAT TO EXPECT AT YOUR  PRENATAL EXAMS  You will have prenatal exams every 2 weeks until week 36. Then, you will have weekly prenatal exams. During a routine prenatal visit:  You will be weighed to make sure you and the fetus are growing normally.  Your blood pressure is taken.  Your abdomen will be measured to track your baby's growth.  The fetal heartbeat will be listened to.  Any test results from the previous visit will be discussed.  You may have a cervical check near your due date to see if you have effaced. At around 36 weeks, your caregiver will check your cervix. At the same time, your caregiver will also perform a test on the secretions of the vaginal tissue. This test is to determine if a type of bacteria, Group B streptococcus, is present. Your caregiver will explain this further. Your caregiver may ask you:  What your birth plan is.  How you are feeling.  If you are feeling the baby move.  If you have had any abnormal symptoms, such as leaking fluid, bleeding, severe headaches, or abdominal cramping.  If you have any questions. Other tests or screenings that may be performed during your third trimester include:  Blood tests that check for low iron levels (anemia).  Fetal testing to check the health, activity level, and growth of the fetus. Testing is done if you have certain medical conditions or if there are problems during the pregnancy. FALSE LABOR You may feel small, irregular contractions that   eventually go away. These are called Braxton Hicks contractions, or false labor. Contractions may last for hours, days, or even weeks before true labor sets in. If contractions come at regular intervals, intensify, or become painful, it is best to be seen by your caregiver.  SIGNS OF LABOR   Menstrual-like cramps.  Contractions that are 5 minutes apart or less.  Contractions that start on the top of the uterus and spread down to the lower abdomen and back.  A sense of increased pelvic  pressure or back pain.  A watery or bloody mucus discharge that comes from the vagina. If you have any of these signs before the 37th week of pregnancy, call your caregiver right away. You need to go to the hospital to get checked immediately. HOME CARE INSTRUCTIONS   Avoid all smoking, herbs, alcohol, and unprescribed drugs. These chemicals affect the formation and growth of the baby.  Follow your caregiver's instructions regarding medicine use. There are medicines that are either safe or unsafe to take during pregnancy.  Exercise only as directed by your caregiver. Experiencing uterine cramps is a good sign to stop exercising.  Continue to eat regular, healthy meals.  Wear a good support bra for breast tenderness.  Do not use hot tubs, steam rooms, or saunas.  Wear your seat belt at all times when driving.  Avoid raw meat, uncooked cheese, cat litter boxes, and soil used by cats. These carry germs that can cause birth defects in the baby.  Take your prenatal vitamins.  Try taking a stool softener (if your caregiver approves) if you develop constipation. Eat more high-fiber foods, such as fresh vegetables or fruit and whole grains. Drink plenty of fluids to keep your urine clear or pale yellow.  Take warm sitz baths to soothe any pain or discomfort caused by hemorrhoids. Use hemorrhoid cream if your caregiver approves.  If you develop varicose veins, wear support hose. Elevate your feet for 15 minutes, 3-4 times a day. Limit salt in your diet.  Avoid heavy lifting, wear low heal shoes, and practice good posture.  Rest a lot with your legs elevated if you have leg cramps or low back pain.  Visit your dentist if you have not gone during your pregnancy. Use a soft toothbrush to brush your teeth and be gentle when you floss.  A sexual relationship may be continued unless your caregiver directs you otherwise.  Do not travel far distances unless it is absolutely necessary and only  with the approval of your caregiver.  Take prenatal classes to understand, practice, and ask questions about the labor and delivery.  Make a trial run to the hospital.  Pack your hospital bag.  Prepare the baby's nursery.  Continue to go to all your prenatal visits as directed by your caregiver. SEEK MEDICAL CARE IF:  You are unsure if you are in labor or if your water has broken.  You have dizziness.  You have mild pelvic cramps, pelvic pressure, or nagging pain in your abdominal area.  You have persistent nausea, vomiting, or diarrhea.  You have a bad smelling vaginal discharge.  You have pain with urination. SEEK IMMEDIATE MEDICAL CARE IF:   You have a fever.  You are leaking fluid from your vagina.  You have spotting or bleeding from your vagina.  You have severe abdominal cramping or pain.  You have rapid weight loss or gain.  You have shortness of breath with chest pain.  You notice sudden or extreme swelling   of your face, hands, ankles, feet, or legs.  You have not felt your baby move in over an hour.  You have severe headaches that do not go away with medicine.  You have vision changes. Document Released: 04/18/2001 Document Revised: 04/29/2013 Document Reviewed: 06/25/2012 [employer] Patient Information 2015 [employer]. This information is not intended to replace advice given to you by your health care provider. Make sure you discuss any questions you have with your health care provider.  Breastfeeding Deciding to breastfeed is one of the best choices you can make for you and your baby. A change in hormones during pregnancy causes your breast tissue to grow and increases the number and size of your milk ducts. These hormones also allow proteins, sugars, and fats from your blood supply to make breast milk in your milk-producing glands. Hormones prevent breast milk from being released before your baby is born as well as prompt milk flow after birth. Once  breastfeeding has begun, thoughts of your baby, as well as his or her sucking or crying, can stimulate the release of milk from your milk-producing glands.  BENEFITS OF BREASTFEEDING For Your Baby  Your first milk (colostrum) helps your baby's digestive system function better.   There are antibodies in your milk that help your baby fight off infections.   Your baby has a lower incidence of asthma, allergies, and sudden infant death syndrome.   The nutrients in breast milk are better for your baby than infant formulas and are designed uniquely for your baby's needs.   Breast milk improves your baby's brain development.   Your baby is less likely to develop other conditions, such as childhood obesity, asthma, or type 2 diabetes mellitus.  For You   Breastfeeding helps to create a very special bond between you and your baby.   Breastfeeding is convenient. Breast milk is always available at the correct temperature and costs nothing.   Breastfeeding helps to burn calories and helps you lose the weight gained during pregnancy.   Breastfeeding makes your uterus contract to its prepregnancy size faster and slows bleeding (lochia) after you give birth.   Breastfeeding helps to lower your risk of developing type 2 diabetes mellitus, osteoporosis, and breast or ovarian cancer later in life. SIGNS THAT YOUR BABY IS HUNGRY Early Signs of Hunger  Increased alertness or activity.  Stretching.  Movement of the head from side to side.  Movement of the head and opening of the mouth when the corner of the mouth or cheek is stroked (rooting).  Increased sucking sounds, smacking lips, cooing, sighing, or squeaking.  Hand-to-mouth movements.  Increased sucking of fingers or hands. Late Signs of Hunger  Fussing.  Intermittent crying. Extreme Signs of Hunger Signs of extreme hunger will require calming and consoling before your baby will be able to breastfeed successfully. Do not  wait for the following signs of extreme hunger to occur before you initiate breastfeeding:   Restlessness.  A loud, strong cry.   Screaming. BREASTFEEDING BASICS Breastfeeding Initiation  Find a comfortable place to sit or lie down, with your neck and back well supported.  Place a pillow or rolled up blanket under your baby to bring him or her to the level of your breast (if you are seated). Nursing pillows are specially designed to help support your arms and your baby while you breastfeed.  Make sure that your baby's abdomen is facing your abdomen.   Gently massage your breast. With your fingertips, massage from your chest   wall toward your nipple in a circular motion. This encourages milk flow. You may need to continue this action during the feeding if your milk flows slowly.  Support your breast with 4 fingers underneath and your thumb above your nipple. Make sure your fingers are well away from your nipple and your baby's mouth.   Stroke your baby's lips gently with your finger or nipple.   When your baby's mouth is open wide enough, quickly bring your baby to your breast, placing your entire nipple and as much of the colored area around your nipple (areola) as possible into your baby's mouth.   More areola should be visible above your baby's upper lip than below the lower lip.   Your baby's tongue should be between his or her lower gum and your breast.   Ensure that your baby's mouth is correctly positioned around your nipple (latched). Your baby's lips should create a seal on your breast and be turned out (everted).  It is common for your baby to suck about 2-3 minutes in order to start the flow of breast milk. Latching Teaching your baby how to latch on to your breast properly is very important. An improper latch can cause nipple pain and decreased milk supply for you and poor weight gain in your baby. Also, if your baby is not latched onto your nipple properly, he or she  may swallow some air during feeding. This can make your baby fussy. Burping your baby when you switch breasts during the feeding can help to get rid of the air. However, teaching your baby to latch on properly is still the best way to prevent fussiness from swallowing air while breastfeeding. Signs that your baby has successfully latched on to your nipple:    Silent tugging or silent sucking, without causing you pain.   Swallowing heard between every 3-4 sucks.    Muscle movement above and in front of his or her ears while sucking.  Signs that your baby has not successfully latched on to nipple:   Sucking sounds or smacking sounds from your baby while breastfeeding.  Nipple pain. If you think your baby has not latched on correctly, slip your finger into the corner of your baby's mouth to break the suction and place it between your baby's gums. Attempt breastfeeding initiation again. Signs of Successful Breastfeeding Signs from your baby:   A gradual decrease in the number of sucks or complete cessation of sucking.   Falling asleep.   Relaxation of his or her body.   Retention of a small amount of milk in his or her mouth.   Letting go of your breast by himself or herself. Signs from you:  Breasts that have increased in firmness, weight, and size 1-3 hours after feeding.   Breasts that are softer immediately after breastfeeding.  Increased milk volume, as well as a change in milk consistency and color by the fifth day of breastfeeding.   Nipples that are not sore, cracked, or bleeding. Signs That Your Baby is Getting Enough Milk  Wetting at least 3 diapers in a 24-hour period. The urine should be clear and pale yellow by age 5 days.  At least 3 stools in a 24-hour period by age 5 days. The stool should be soft and yellow.  At least 3 stools in a 24-hour period by age 7 days. The stool should be seedy and yellow.  No loss of weight greater than 10% of birth weight  during the first 3   days of age.  Average weight gain of 4-7 ounces (113-198 g) per week after age 4 days.  Consistent daily weight gain by age 5 days, without weight loss after the age of 2 weeks. After a feeding, your baby may spit up a small amount. This is common. BREASTFEEDING FREQUENCY AND DURATION Frequent feeding will help you make more milk and can prevent sore nipples and breast engorgement. Breastfeed when you feel the need to reduce the fullness of your breasts or when your baby shows signs of hunger. This is called "breastfeeding on demand." Avoid introducing a pacifier to your baby while you are working to establish breastfeeding (the first 4-6 weeks after your baby is born). After this time you may choose to use a pacifier. Research has shown that pacifier use during the first year of a baby's life decreases the risk of sudden infant death syndrome (SIDS). Allow your baby to feed on each breast as long as he or she wants. Breastfeed until your baby is finished feeding. When your baby unlatches or falls asleep while feeding from the first breast, offer the second breast. Because newborns are often sleepy in the first few weeks of life, you may need to awaken your baby to get him or her to feed. Breastfeeding times will vary from baby to baby. However, the following rules can serve as a guide to help you ensure that your baby is properly fed:  Newborns (babies 4 weeks of age or younger) may breastfeed every 1-3 hours.  Newborns should not go longer than 3 hours during the day or 5 hours during the night without breastfeeding.  You should breastfeed your baby a minimum of 8 times in a 24-hour period until you begin to introduce solid foods to your baby at around 6 months of age. BREAST MILK PUMPING Pumping and storing breast milk allows you to ensure that your baby is exclusively fed your breast milk, even at times when you are unable to breastfeed. This is especially important if you are  going back to work while you are still breastfeeding or when you are not able to be present during feedings. Your lactation consultant can give you guidelines on how long it is safe to store breast milk.  A breast pump is a machine that allows you to pump milk from your breast into a sterile bottle. The pumped breast milk can then be stored in a refrigerator or freezer. Some breast pumps are operated by hand, while others use electricity. Ask your lactation consultant which type will work best for you. Breast pumps can be purchased, but some hospitals and breastfeeding support groups lease breast pumps on a monthly basis. A lactation consultant can teach you how to hand express breast milk, if you prefer not to use a pump.  CARING FOR YOUR BREASTS WHILE YOU BREASTFEED Nipples can become dry, cracked, and sore while breastfeeding. The following recommendations can help keep your breasts moisturized and healthy:  Avoid using soap on your nipples.   Wear a supportive bra. Although not required, special nursing bras and tank tops are designed to allow access to your breasts for breastfeeding without taking off your entire bra or top. Avoid wearing underwire-style bras or extremely tight bras.  Air dry your nipples for 3-4minutes after each feeding.   Use only cotton bra pads to absorb leaked breast milk. Leaking of breast milk between feedings is normal.   Use lanolin on your nipples after breastfeeding. Lanolin helps to maintain your skin's   normal moisture barrier. If you use pure lanolin, you do not need to wash it off before feeding your baby again. Pure lanolin is not toxic to your baby. You may also hand express a few drops of breast milk and gently massage that milk into your nipples and allow the milk to air dry. In the first few weeks after giving birth, some women experience extremely full breasts (engorgement). Engorgement can make your breasts feel heavy, warm, and tender to the touch.  Engorgement peaks within 3-5 days after you give birth. The following recommendations can help ease engorgement:  Completely empty your breasts while breastfeeding or pumping. You may want to start by applying warm, moist heat (in the shower or with warm water-soaked hand towels) just before feeding or pumping. This increases circulation and helps the milk flow. If your baby does not completely empty your breasts while breastfeeding, pump any extra milk after he or she is finished.  Wear a snug bra (nursing or regular) or tank top for 1-2 days to signal your body to slightly decrease milk production.  Apply ice packs to your breasts, unless this is too uncomfortable for you.  Make sure that your baby is latched on and positioned properly while breastfeeding. If engorgement persists after 48 hours of following these recommendations, contact your health care provider or a lactation consultant. OVERALL HEALTH CARE RECOMMENDATIONS WHILE BREASTFEEDING  Eat healthy foods. Alternate between meals and snacks, eating 3 of each per day. Because what you eat affects your breast milk, some of the foods may make your baby more irritable than usual. Avoid eating these foods if you are sure that they are negatively affecting your baby.  Drink milk, fruit juice, and water to satisfy your thirst (about 10 glasses a day).   Rest often, relax, and continue to take your prenatal vitamins to prevent fatigue, stress, and anemia.  Continue breast self-awareness checks.  Avoid chewing and smoking tobacco.  Avoid alcohol and drug use. Some medicines that may be harmful to your baby can pass through breast milk. It is important to ask your health care provider before taking any medicine, including all over-the-counter and prescription medicine as well as vitamin and herbal supplements. It is possible to become pregnant while breastfeeding. If birth control is desired, ask your health care provider about options that  will be safe for your baby. SEEK MEDICAL CARE IF:   You feel like you want to stop breastfeeding or have become frustrated with breastfeeding.  You have painful breasts or nipples.  Your nipples are cracked or bleeding.  Your breasts are red, tender, or warm.  You have a swollen area on either breast.  You have a fever or chills.  You have nausea or vomiting.  You have drainage other than breast milk from your nipples.  Your breasts do not become full before feedings by the fifth day after you give birth.  You feel sad and depressed.  Your baby is too sleepy to eat well.  Your baby is having trouble sleeping.   Your baby is wetting less than 3 diapers in a 24-hour period.  Your baby has less than 3 stools in a 24-hour period.  Your baby's skin or the white part of his or her eyes becomes yellow.   Your baby is not gaining weight by 5 days of age. SEEK IMMEDIATE MEDICAL CARE IF:   Your baby is overly tired (lethargic) and does not want to wake up and feed.  Your baby   develops an unexplained fever. Document Released: 04/24/2005 Document Revised: 04/29/2013 Document Reviewed: 10/16/2012 [employer] Patient Information 2015 [employer]. This information is not intended to replace advice given to you by your health care provider. Make sure you discuss any questions you have with your health care provider.  

## 2013-12-27 LAB — CBC
HEMATOCRIT: 37 % (ref 36.0–46.0)
HEMOGLOBIN: 12.4 g/dL (ref 12.0–15.0)
MCH: 29.1 pg (ref 26.0–34.0)
MCHC: 33.5 g/dL (ref 30.0–36.0)
MCV: 86.9 fL (ref 78.0–100.0)
PLATELETS: 181 10*3/uL (ref 150–400)
RBC: 4.26 MIL/uL (ref 3.87–5.11)
RDW: 13.8 % (ref 11.5–15.5)
WBC: 9.8 10*3/uL (ref 4.0–10.5)

## 2013-12-27 LAB — RPR

## 2013-12-27 LAB — GLUCOSE TOLERANCE, 1 HOUR (50G) W/O FASTING: GLUCOSE 1 HOUR GTT: 156 mg/dL — AB (ref 70–140)

## 2013-12-27 LAB — HIV ANTIBODY (ROUTINE TESTING W REFLEX): HIV 1&2 Ab, 4th Generation: NONREACTIVE

## 2014-01-07 ENCOUNTER — Other Ambulatory Visit (INDEPENDENT_AMBULATORY_CARE_PROVIDER_SITE_OTHER): Payer: 59 | Admitting: *Deleted

## 2014-01-07 DIAGNOSIS — O99810 Abnormal glucose complicating pregnancy: Secondary | ICD-10-CM

## 2014-01-07 DIAGNOSIS — R7309 Other abnormal glucose: Secondary | ICD-10-CM

## 2014-01-08 LAB — GLUCOSE TOLERANCE, 3 HOURS
GLUCOSE 3 HOUR GTT: 106 mg/dL (ref 70–144)
GLUCOSE, 1 HOUR-GESTATIONAL: 205 mg/dL — AB (ref 70–189)
GLUCOSE, 2 HOUR-GESTATIONAL: 174 mg/dL — AB (ref 70–164)
Glucose Tolerance, Fasting: 91 mg/dL (ref 70–104)

## 2014-01-09 ENCOUNTER — Encounter: Payer: Self-pay | Admitting: Obstetrics and Gynecology

## 2014-01-09 ENCOUNTER — Ambulatory Visit (INDEPENDENT_AMBULATORY_CARE_PROVIDER_SITE_OTHER): Payer: 59 | Admitting: Obstetrics and Gynecology

## 2014-01-09 VITALS — BP 100/65 | HR 91 | Wt 154.8 lb

## 2014-01-09 DIAGNOSIS — O24419 Gestational diabetes mellitus in pregnancy, unspecified control: Secondary | ICD-10-CM

## 2014-01-09 DIAGNOSIS — Z3403 Encounter for supervision of normal first pregnancy, third trimester: Secondary | ICD-10-CM

## 2014-01-09 DIAGNOSIS — O99810 Abnormal glucose complicating pregnancy: Secondary | ICD-10-CM

## 2014-01-09 DIAGNOSIS — Z3400 Encounter for supervision of normal first pregnancy, unspecified trimester: Secondary | ICD-10-CM

## 2014-01-09 MED ORDER — ACCU-CHEK NANO SMARTVIEW W/DEVICE KIT
1.0000 | PACK | Freq: Four times a day (QID) | Status: DC
Start: 2014-01-09 — End: 2014-03-17

## 2014-01-09 MED ORDER — ACCU-CHEK FASTCLIX LANCETS MISC: 1.0000 [IU] | Freq: Four times a day (QID) | Status: DC

## 2014-01-09 MED ORDER — GLUCOSE BLOOD VI STRP: ORAL_STRIP | Status: DC

## 2014-01-09 MED ORDER — HYDROCORTISONE 2.5 % RE CREA: 1.0000 "application " | TOPICAL_CREAM | Freq: Two times a day (BID) | RECTAL | Status: DC

## 2014-01-09 MED ORDER — PE-SHARK LIVER OIL-COCOA BUTTR 0.25-3-85.5 % RE SUPP: 1.0000 | RECTAL | Status: DC | PRN

## 2014-01-09 NOTE — Addendum Note (Signed)
Addended by: Catalina Antigua on: 01/09/2014 09:40 AM   Modules accepted: Orders, Medications

## 2014-01-09 NOTE — Progress Notes (Signed)
Patient feels like she may have a hemorrhoid.

## 2014-01-09 NOTE — Progress Notes (Signed)
Patient is doing well. She was informed of abnormal 3 hour test and diagnosis of gestational diabetes. Patient became very doubtful and tearful when she heard these results. She feels that it is impossible for her to be diabetic because she is "allergic" to sugars and tries to avoid them in her diet. Discussed the role of the placenta in contributing to this diagnosis. Discussed maternal/fetal risks associated with poorly controlled diabetes including risks of fetal macrosomia, shoulder dystocia resulting in neurological damage, and fetal/neonatal death. Patient and her husband verbalized understanding and all questions were answered. Patient will be scheduled to meet with diabetic educator as soon as possible.

## 2014-01-21 ENCOUNTER — Encounter: Payer: 59 | Attending: Obstetrics and Gynecology

## 2014-01-21 VITALS — Ht 62.0 in | Wt 154.0 lb

## 2014-01-21 DIAGNOSIS — Z713 Dietary counseling and surveillance: Secondary | ICD-10-CM | POA: Diagnosis not present

## 2014-01-21 DIAGNOSIS — O99810 Abnormal glucose complicating pregnancy: Secondary | ICD-10-CM | POA: Diagnosis not present

## 2014-01-22 NOTE — Progress Notes (Signed)
  Patient was seen on 01/21/14 for Gestational Diabetes self-management . The following learning objectives were met by the patient :   States the definition of Gestational Diabetes  States why dietary management is important in controlling blood glucose  Describes the effects of carbohydrates on blood glucose levels  Demonstrates ability to create a balanced meal plan  Demonstrates carbohydrate counting   States when to check blood glucose levels  Demonstrates proper blood glucose monitoring techniques  States the effect of stress and exercise on blood glucose levels  States the importance of limiting caffeine and abstaining from alcohol and smoking  Plan:  Aim for 2 Carb Choices per meal (30 grams) +/- 1 either way for breakfast Aim for 3 Carb Choices per meal (45 grams) +/- 1 either way from lunch and dinner Aim for 1-2 Carbs per snack Begin reading food labels for Total Carbohydrate and sugar grams of foods Consider  increasing your activity level by walking daily as tolerated Begin checking BG before breakfast and 2 hours after first bit of breakfast, lunch and dinner after  as directed by MD  Take medication  as directed by MD  Patient instructed to monitor glucose levels: FBS: 60 - <90 2 hour: <120  Patient received the following handouts:  Nutrition Diabetes and Pregnancy  Carbohydrate Counting List  Meal Planning worksheet  Patient will be seen for follow-up as needed.

## 2014-01-23 ENCOUNTER — Encounter: Payer: Self-pay | Admitting: Obstetrics & Gynecology

## 2014-01-23 ENCOUNTER — Ambulatory Visit (INDEPENDENT_AMBULATORY_CARE_PROVIDER_SITE_OTHER): Payer: 59 | Admitting: Obstetrics & Gynecology

## 2014-01-23 VITALS — BP 108/59 | HR 78 | Wt 155.0 lb

## 2014-01-23 DIAGNOSIS — O24419 Gestational diabetes mellitus in pregnancy, unspecified control: Secondary | ICD-10-CM

## 2014-01-23 DIAGNOSIS — O99810 Abnormal glucose complicating pregnancy: Secondary | ICD-10-CM

## 2014-01-23 DIAGNOSIS — Z3400 Encounter for supervision of normal first pregnancy, unspecified trimester: Secondary | ICD-10-CM

## 2014-01-23 DIAGNOSIS — Z23 Encounter for immunization: Secondary | ICD-10-CM

## 2014-01-23 DIAGNOSIS — Z3402 Encounter for supervision of normal first pregnancy, second trimester: Secondary | ICD-10-CM

## 2014-01-23 MED ORDER — GLUCOSE BLOOD VI STRP: ORAL_STRIP | Status: DC

## 2014-01-23 MED ORDER — ACCU-CHEK FASTCLIX LANCETS MISC: 1.0000 [IU] | Freq: Four times a day (QID) | Status: DC

## 2014-01-23 NOTE — Progress Notes (Signed)
Sugars ok, not perfect. However, she just now was able to see the nutritionist, so we will re evaluate her sugars in a week and decide then if she needs meds.

## 2014-01-23 NOTE — Progress Notes (Signed)
Routine visit. Good FM. Check HBA1C. Rec Prep H.

## 2014-01-29 ENCOUNTER — Ambulatory Visit (INDEPENDENT_AMBULATORY_CARE_PROVIDER_SITE_OTHER): Payer: 59 | Admitting: Obstetrics & Gynecology

## 2014-01-29 ENCOUNTER — Encounter: Payer: Self-pay | Admitting: Obstetrics & Gynecology

## 2014-01-29 ENCOUNTER — Encounter (INDEPENDENT_AMBULATORY_CARE_PROVIDER_SITE_OTHER): Payer: 59 | Admitting: *Deleted

## 2014-01-29 VITALS — BP 110/67 | HR 89 | Wt 158.2 lb

## 2014-01-29 DIAGNOSIS — Z3480 Encounter for supervision of other normal pregnancy, unspecified trimester: Secondary | ICD-10-CM

## 2014-01-29 DIAGNOSIS — Z3400 Encounter for supervision of normal first pregnancy, unspecified trimester: Secondary | ICD-10-CM

## 2014-01-29 DIAGNOSIS — Z3403 Encounter for supervision of normal first pregnancy, third trimester: Secondary | ICD-10-CM

## 2014-01-29 NOTE — Progress Notes (Signed)
FMLA forms filled out and faxed

## 2014-01-29 NOTE — Patient Instructions (Signed)
Third Trimester of Pregnancy The third trimester is from week 29 through week 42, months 7 through 9. The third trimester is a time when the fetus is growing rapidly. At the end of the ninth month, the fetus is about 20 inches in length and weighs 6-10 pounds.  BODY CHANGES Your body goes through many changes during pregnancy. The changes vary from woman to woman.   Your weight will continue to increase. You can expect to gain 25-35 pounds (11-16 kg) by the end of the pregnancy.  You may begin to get stretch marks on your hips, abdomen, and breasts.  You may urinate more often because the fetus is moving lower into your pelvis and pressing on your bladder.  You may develop or continue to have heartburn as a result of your pregnancy.  You may develop constipation because certain hormones are causing the muscles that push waste through your intestines to slow down.  You may develop hemorrhoids or swollen, bulging veins (varicose veins).  You may have pelvic pain because of the weight gain and pregnancy hormones relaxing your joints between the bones in your pelvis. Backaches may result from overexertion of the muscles supporting your posture.  You may have changes in your hair. These can include thickening of your hair, rapid growth, and changes in texture. Some women also have hair loss during or after pregnancy, or hair that feels dry or thin. Your hair will most likely return to normal after your baby is born.  Your breasts will continue to grow and be tender. A yellow discharge may leak from your breasts called colostrum.  Your belly button may stick out.  You may feel short of breath because of your expanding uterus.  You may notice the fetus "dropping," or moving lower in your abdomen.  You may have a bloody mucus discharge. This usually occurs a few days to a week before labor begins.  Your cervix becomes thin and soft (effaced) near your due date. WHAT TO EXPECT AT YOUR PRENATAL  EXAMS  You will have prenatal exams every 2 weeks until week 36. Then, you will have weekly prenatal exams. During a routine prenatal visit:  You will be weighed to make sure you and the fetus are growing normally.  Your blood pressure is taken.  Your abdomen will be measured to track your baby's growth.  The fetal heartbeat will be listened to.  Any test results from the previous visit will be discussed.  You may have a cervical check near your due date to see if you have effaced. At around 36 weeks, your caregiver will check your cervix. At the same time, your caregiver will also perform a test on the secretions of the vaginal tissue. This test is to determine if a type of bacteria, Group B streptococcus, is present. Your caregiver will explain this further. Your caregiver may ask you:  What your birth plan is.  How you are feeling.  If you are feeling the baby move.  If you have had any abnormal symptoms, such as leaking fluid, bleeding, severe headaches, or abdominal cramping.  If you have any questions. Other tests or screenings that may be performed during your third trimester include:  Blood tests that check for low iron levels (anemia).  Fetal testing to check the health, activity level, and growth of the fetus. Testing is done if you have certain medical conditions or if there are problems during the pregnancy. FALSE LABOR You may feel small, irregular contractions that   eventually go away. These are called Braxton Hicks contractions, or false labor. Contractions may last for hours, days, or even weeks before true labor sets in. If contractions come at regular intervals, intensify, or become painful, it is best to be seen by your caregiver.  SIGNS OF LABOR   Menstrual-like cramps.  Contractions that are 5 minutes apart or less.  Contractions that start on the top of the uterus and spread down to the lower abdomen and back.  A sense of increased pelvic pressure or back  pain.  A watery or bloody mucus discharge that comes from the vagina. If you have any of these signs before the 37th week of pregnancy, call your caregiver right away. You need to go to the hospital to get checked immediately. HOME CARE INSTRUCTIONS   Avoid all smoking, herbs, alcohol, and unprescribed drugs. These chemicals affect the formation and growth of the baby.  Follow your caregiver's instructions regarding medicine use. There are medicines that are either safe or unsafe to take during pregnancy.  Exercise only as directed by your caregiver. Experiencing uterine cramps is a good sign to stop exercising.  Continue to eat regular, healthy meals.  Wear a good support bra for breast tenderness.  Do not use hot tubs, steam rooms, or saunas.  Wear your seat belt at all times when driving.  Avoid raw meat, uncooked cheese, cat litter boxes, and soil used by cats. These carry germs that can cause birth defects in the baby.  Take your prenatal vitamins.  Try taking a stool softener (if your caregiver approves) if you develop constipation. Eat more high-fiber foods, such as fresh vegetables or fruit and whole grains. Drink plenty of fluids to keep your urine clear or pale yellow.  Take warm sitz baths to soothe any pain or discomfort caused by hemorrhoids. Use hemorrhoid cream if your caregiver approves.  If you develop varicose veins, wear support hose. Elevate your feet for 15 minutes, 3-4 times a day. Limit salt in your diet.  Avoid heavy lifting, wear low heal shoes, and practice good posture.  Rest a lot with your legs elevated if you have leg cramps or low back pain.  Visit your dentist if you have not gone during your pregnancy. Use a soft toothbrush to brush your teeth and be gentle when you floss.  A sexual relationship may be continued unless your caregiver directs you otherwise.  Do not travel far distances unless it is absolutely necessary and only with the approval  of your caregiver.  Take prenatal classes to understand, practice, and ask questions about the labor and delivery.  Make a trial run to the hospital.  Pack your hospital bag.  Prepare the baby's nursery.  Continue to go to all your prenatal visits as directed by your caregiver. SEEK MEDICAL CARE IF:  You are unsure if you are in labor or if your water has broken.  You have dizziness.  You have mild pelvic cramps, pelvic pressure, or nagging pain in your abdominal area.  You have persistent nausea, vomiting, or diarrhea.  You have a bad smelling vaginal discharge.  You have pain with urination. SEEK IMMEDIATE MEDICAL CARE IF:   You have a fever.  You are leaking fluid from your vagina.  You have spotting or bleeding from your vagina.  You have severe abdominal cramping or pain.  You have rapid weight loss or gain.  You have shortness of breath with chest pain.  You notice sudden or extreme swelling   of your face, hands, ankles, feet, or legs.  You have not felt your baby move in over an hour.  You have severe headaches that do not go away with medicine.  You have vision changes. Document Released: 04/18/2001 Document Revised: 04/29/2013 Document Reviewed: 06/25/2012 [employer] Patient Information 2015 [employer]. This information is not intended to replace advice given to you by your health care provider. Make sure you discuss any questions you have with your health care provider.  

## 2014-01-29 NOTE — Progress Notes (Signed)
Pt with  No complaints.  Glc levels are imporved today as pt saw the nutritionist and adjusted her diet.   Rec f/u in 2 weeks.  Pt notified to call if GLC levels not well controlled in the interim

## 2014-02-13 ENCOUNTER — Ambulatory Visit (INDEPENDENT_AMBULATORY_CARE_PROVIDER_SITE_OTHER): Payer: 59 | Admitting: Obstetrics & Gynecology

## 2014-02-13 VITALS — BP 128/69 | HR 77 | Wt 157.0 lb

## 2014-02-13 DIAGNOSIS — Z3403 Encounter for supervision of normal first pregnancy, third trimester: Secondary | ICD-10-CM

## 2014-02-13 NOTE — Progress Notes (Signed)
Routine visit. Good FM. No problems. Excellent sugars. Will order u/s for s less than dates. Cultures at next visit.

## 2014-02-18 ENCOUNTER — Other Ambulatory Visit: Payer: Self-pay | Admitting: Obstetrics & Gynecology

## 2014-02-18 ENCOUNTER — Ambulatory Visit (HOSPITAL_COMMUNITY)
Admission: RE | Admit: 2014-02-18 | Discharge: 2014-02-18 | Disposition: A | Discharge status: Home / Self Care | Payer: 59 | Source: Ambulatory Visit | Attending: Obstetrics & Gynecology | Admitting: Obstetrics & Gynecology

## 2014-02-18 DIAGNOSIS — O24419 Gestational diabetes mellitus in pregnancy, unspecified control: Secondary | ICD-10-CM

## 2014-02-18 DIAGNOSIS — O365930 Maternal care for other known or suspected poor fetal growth, third trimester, not applicable or unspecified: Secondary | ICD-10-CM | POA: Diagnosis not present

## 2014-02-18 DIAGNOSIS — Z3A35 35 weeks gestation of pregnancy: Secondary | ICD-10-CM | POA: Diagnosis not present

## 2014-02-18 DIAGNOSIS — Z3403 Encounter for supervision of normal first pregnancy, third trimester: Secondary | ICD-10-CM

## 2014-02-18 DIAGNOSIS — O24410 Gestational diabetes mellitus in pregnancy, diet controlled: Secondary | ICD-10-CM | POA: Insufficient documentation

## 2014-02-20 ENCOUNTER — Encounter: Payer: Self-pay | Admitting: Family Medicine

## 2014-02-20 ENCOUNTER — Ambulatory Visit (INDEPENDENT_AMBULATORY_CARE_PROVIDER_SITE_OTHER): Payer: 59 | Admitting: Family Medicine

## 2014-02-20 VITALS — BP 120/75 | HR 87 | Wt 158.0 lb

## 2014-02-20 DIAGNOSIS — O24419 Gestational diabetes mellitus in pregnancy, unspecified control: Secondary | ICD-10-CM

## 2014-02-20 DIAGNOSIS — Z3403 Encounter for supervision of normal first pregnancy, third trimester: Secondary | ICD-10-CM

## 2014-02-20 LAB — OB RESULTS CONSOLE GC/CHLAMYDIA
Chlamydia: NEGATIVE
Gonorrhea: NEGATIVE

## 2014-02-20 LAB — OB RESULTS CONSOLE GBS: GBS: NEGATIVE

## 2014-02-20 NOTE — Patient Instructions (Signed)
Gestational Diabetes Mellitus Gestational diabetes mellitus, often simply referred to as gestational diabetes, is a type of diabetes that some women develop during pregnancy. In gestational diabetes, the pancreas does not make enough insulin (a hormone), the cells are less responsive to the insulin that is made (insulin resistance), or both.Normally, insulin moves sugars from food into the tissue cells. The tissue cells use the sugars for energy. The lack of insulin or the lack of normal response to insulin causes excess sugars to build up in the blood instead of going into the tissue cells. As a result, high blood sugar (hyperglycemia) develops. The effect of high sugar (glucose) levels can cause many problems.  RISK FACTORS You have an increased chance of developing gestational diabetes if you have a family history of diabetes and also have one or more of the following risk factors:  A body mass index over 30 (obesity).  A previous pregnancy with gestational diabetes.  An older age at the time of pregnancy. If blood glucose levels are kept in the normal range during pregnancy, women can have a healthy pregnancy. If your blood glucose levels are not well controlled, there may be risks to you, your unborn baby (fetus), your labor and delivery, or your newborn baby.  SYMPTOMS  If symptoms are experienced, they are much like symptoms you would normally expect during pregnancy. The symptoms of gestational diabetes include:   Increased thirst (polydipsia).  Increased urination (polyuria).  Increased urination during the night (nocturia).  Weight loss. This weight loss may be rapid.  Frequent, recurring infections.  Tiredness (fatigue).  Weakness.  Vision changes, such as blurred vision.  Fruity smell to your breath.  Abdominal pain. DIAGNOSIS Diabetes is diagnosed when blood glucose levels are increased. Your blood glucose level may be checked by one or more of the following blood  tests:  A fasting blood glucose test. You will not be allowed to eat for at least 8 hours before a blood sample is taken.  A random blood glucose test. Your blood glucose is checked at any time of the day regardless of when you ate.  A hemoglobin A1c blood glucose test. A hemoglobin A1c test provides information about blood glucose control over the previous 3 months.  An oral glucose tolerance test (OGTT). Your blood glucose is measured after you have not eaten (fasted) for 1-3 hours and then after you drink a glucose-containing beverage. Since the hormones that cause insulin resistance are highest at about 24-28 weeks of a pregnancy, an OGTT is usually performed during that time. If you have risk factors for gestational diabetes, your health care provider may test you for gestational diabetes earlier than 24 weeks of pregnancy. TREATMENT   You will need to take diabetes medicine or insulin daily to keep blood glucose levels in the desired range.  You will need to match insulin dosing with exercise and healthy food choices. The treatment goal is to maintain the before-meal (preprandial), bedtime, and overnight blood glucose level at 60-99 mg/dL during pregnancy. The treatment goal is to further maintain peak after-meal blood sugar (postprandial glucose) level at 100-140 mg/dL. HOME CARE INSTRUCTIONS   Have your hemoglobin A1c level checked twice a year.  Perform daily blood glucose monitoring as directed by your health care provider. It is common to perform frequent blood glucose monitoring.  Monitor urine ketones when you are ill and as directed by your health care provider.  Take your diabetes medicine and insulin as directed by your health care provider   to maintain your blood glucose level in the desired range.  Never run out of diabetes medicine or insulin. It is needed every day.  Adjust insulin based on your intake of carbohydrates. Carbohydrates can raise blood glucose levels but  need to be included in your diet. Carbohydrates provide vitamins, minerals, and fiber which are an essential part of a healthy diet. Carbohydrates are found in fruits, vegetables, whole grains, dairy products, legumes, and foods containing added sugars.  Eat healthy foods. Alternate 3 meals with 3 snacks.  Maintain a healthy weight gain. The usual total expected weight gain varies according to your prepregnancy body mass index (BMI).  Carry a medical alert card or wear your medical alert jewelry.  Carry a 15-gram carbohydrate snack with you at all times to treat low blood glucose (hypoglycemia). Some examples of 15-gram carbohydrate snacks include:  Glucose tablets, 3 or 4.  Glucose gel, 15-gram tube.  Raisins, 2 tablespoons (24 g).  Jelly beans, 6.  Animal crackers, 8.  Fruit juice, regular soda, or low-fat milk, 4 ounces (120 mL).  Gummy treats, 9.  Recognize hypoglycemia. Hypoglycemia during pregnancy occurs with blood glucose levels of 60 mg/dL and below. The risk for hypoglycemia increases when fasting or skipping meals, during or after intense exercise, and during sleep. Hypoglycemia symptoms can include:  Tremors or shakes.  Decreased ability to concentrate.  Sweating.  Increased heart rate.  Headache.  Dry mouth.  Hunger.  Irritability.  Anxiety.  Restless sleep.  Altered speech or coordination.  Confusion.  Treat hypoglycemia promptly. If you are alert and able to safely swallow, follow the 15:15 rule:  Take 15-20 grams of rapid-acting glucose or carbohydrate. Rapid-acting options include glucose gel, glucose tablets, or 4 ounces (120 mL) of fruit juice, regular soda, or low-fat milk.  Check your blood glucose level 15 minutes after taking the glucose.  Take 15-20 grams more of glucose if the repeat blood glucose level is still 70 mg/dL or below.  Eat a meal or snack within 1 hour once blood glucose levels return to normal.  Be alert to polyuria  (excess urination) and polydipsia (excess thirst) which are early signs of hyperglycemia. An early awareness of hyperglycemia allows for prompt treatment. Treat hyperglycemia as directed by your health care provider.  Engage in at least 30 minutes of physical activity a day or as directed by your health care provider. Ten minutes of physical activity timed 30 minutes after each meal is encouraged to control postprandial blood glucose levels.  Adjust your insulin dosing and food intake as needed if you start a new exercise or sport.  Follow your sick-day plan at any time you are unable to eat or drink as usual.  Avoid tobacco and alcohol use.  Keep all follow-up visits as directed by your health care provider.  Follow the advice of your health care provider regarding your prenatal and post-delivery (postpartum) appointments, meal planning, exercise, medicines, vitamins, blood tests, other medical tests, and physical activities.  Perform daily skin and foot care. Examine your skin and feet daily for cuts, bruises, redness, nail problems, bleeding, blisters, or sores.  Brush your teeth and gums at least twice a day and floss at least once a day. Follow up with your dentist regularly.  Schedule an eye exam during the first trimester of your pregnancy or as directed by your health care provider.  Share your diabetes management plan with your workplace or school.  Stay up-to-date with immunizations.  Learn to manage stress.    Obtain ongoing diabetes education and support as needed.  Learn about and consider breastfeeding your baby.  You should have your blood sugar level checked 6-12 weeks after delivery. This is done with an oral glucose tolerance test (OGTT). SEEK MEDICAL CARE IF:   You are unable to eat food or drink fluids for more than 6 hours.  You have nausea and vomiting for more than 6 hours.  You have a blood glucose level of 200 mg/dL and you have ketones in your  urine.  There is a change in mental status.  You develop vision problems.  You have a persistent headache.  You have upper abdominal pain or discomfort.  You develop an additional serious illness.  You have diarrhea for more than 6 hours.  You have been sick or have had a fever for a couple of days and are not getting better. SEEK IMMEDIATE MEDICAL CARE IF:   You have difficulty breathing.  You no longer feel the baby moving.  You are bleeding or have discharge from your vagina.  You start having premature contractions or labor. MAKE SURE YOU:  Understand these instructions.  Will watch your condition.  Will get help right away if you are not doing well or get worse. Document Released: 07/31/2000 Document Revised: 09/08/2013 Document Reviewed: 11/21/2011 [employer] Patient Information 2015 [employer]. This information is not intended to replace advice given to you by your health care provider. Make sure you discuss any questions you have with your health care provider.  Third Trimester of Pregnancy The third trimester is from week 29 through week 42, months 7 through 9. The third trimester is a time when the fetus is growing rapidly. At the end of the ninth month, the fetus is about 20 inches in length and weighs 6-10 pounds.  BODY CHANGES Your body goes through many changes during pregnancy. The changes vary from woman to woman.   Your weight will continue to increase. You can expect to gain 25-35 pounds (11-16 kg) by the end of the pregnancy.  You may begin to get stretch marks on your hips, abdomen, and breasts.  You may urinate more often because the fetus is moving lower into your pelvis and pressing on your bladder.  You may develop or continue to have heartburn as a result of your pregnancy.  You may develop constipation because certain hormones are causing the muscles that push waste through your intestines to slow down.  You may develop hemorrhoids or  swollen, bulging veins (varicose veins).  You may have pelvic pain because of the weight gain and pregnancy hormones relaxing your joints between the bones in your pelvis. Backaches may result from overexertion of the muscles supporting your posture.  You may have changes in your hair. These can include thickening of your hair, rapid growth, and changes in texture. Some women also have hair loss during or after pregnancy, or hair that feels dry or thin. Your hair will most likely return to normal after your baby is born.  Your breasts will continue to grow and be tender. A yellow discharge may leak from your breasts called colostrum.  Your belly button may stick out.  You may feel short of breath because of your expanding uterus.  You may notice the fetus "dropping," or moving lower in your abdomen.  You may have a bloody mucus discharge. This usually occurs a few days to a week before labor begins.  Your cervix becomes thin and soft (effaced) near your due   date. WHAT TO EXPECT AT YOUR PRENATAL EXAMS  You will have prenatal exams every 2 weeks until week 36. Then, you will have weekly prenatal exams. During a routine prenatal visit:  You will be weighed to make sure you and the fetus are growing normally.  Your blood pressure is taken.  Your abdomen will be measured to track your baby's growth.  The fetal heartbeat will be listened to.  Any test results from the previous visit will be discussed.  You may have a cervical check near your due date to see if you have effaced. At around 36 weeks, your caregiver will check your cervix. At the same time, your caregiver will also perform a test on the secretions of the vaginal tissue. This test is to determine if a type of bacteria, Group B streptococcus, is present. Your caregiver will explain this further. Your caregiver may ask you:  What your birth plan is.  How you are feeling.  If you are feeling the baby move.  If you have had  any abnormal symptoms, such as leaking fluid, bleeding, severe headaches, or abdominal cramping.  If you have any questions. Other tests or screenings that may be performed during your third trimester include:  Blood tests that check for low iron levels (anemia).  Fetal testing to check the health, activity level, and growth of the fetus. Testing is done if you have certain medical conditions or if there are problems during the pregnancy. FALSE LABOR You may feel small, irregular contractions that eventually go away. These are called Braxton Hicks contractions, or false labor. Contractions may last for hours, days, or even weeks before true labor sets in. If contractions come at regular intervals, intensify, or become painful, it is best to be seen by your caregiver.  SIGNS OF LABOR   Menstrual-like cramps.  Contractions that are 5 minutes apart or less.  Contractions that start on the top of the uterus and spread down to the lower abdomen and back.  A sense of increased pelvic pressure or back pain.  A watery or bloody mucus discharge that comes from the vagina. If you have any of these signs before the 37th week of pregnancy, call your caregiver right away. You need to go to the hospital to get checked immediately. HOME CARE INSTRUCTIONS   Avoid all smoking, herbs, alcohol, and unprescribed drugs. These chemicals affect the formation and growth of the baby.  Follow your caregiver's instructions regarding medicine use. There are medicines that are either safe or unsafe to take during pregnancy.  Exercise only as directed by your caregiver. Experiencing uterine cramps is a good sign to stop exercising.  Continue to eat regular, healthy meals.  Wear a good support bra for breast tenderness.  Do not use hot tubs, steam rooms, or saunas.  Wear your seat belt at all times when driving.  Avoid raw meat, uncooked cheese, cat litter boxes, and soil used by cats. These carry germs that  can cause birth defects in the baby.  Take your prenatal vitamins.  Try taking a stool softener (if your caregiver approves) if you develop constipation. Eat more high-fiber foods, such as fresh vegetables or fruit and whole grains. Drink plenty of fluids to keep your urine clear or pale yellow.  Take warm sitz baths to soothe any pain or discomfort caused by hemorrhoids. Use hemorrhoid cream if your caregiver approves.  If you develop varicose veins, wear support hose. Elevate your feet for 15 minutes, 3-4 times a   day. Limit salt in your diet.  Avoid heavy lifting, wear low heal shoes, and practice good posture.  Rest a lot with your legs elevated if you have leg cramps or low back pain.  Visit your dentist if you have not gone during your pregnancy. Use a soft toothbrush to brush your teeth and be gentle when you floss.  A sexual relationship may be continued unless your caregiver directs you otherwise.  Do not travel far distances unless it is absolutely necessary and only with the approval of your caregiver.  Take prenatal classes to understand, practice, and ask questions about the labor and delivery.  Make a trial run to the hospital.  Pack your hospital bag.  Prepare the baby's nursery.  Continue to go to all your prenatal visits as directed by your caregiver. SEEK MEDICAL CARE IF:  You are unsure if you are in labor or if your water has broken.  You have dizziness.  You have mild pelvic cramps, pelvic pressure, or nagging pain in your abdominal area.  You have persistent nausea, vomiting, or diarrhea.  You have a bad smelling vaginal discharge.  You have pain with urination. SEEK IMMEDIATE MEDICAL CARE IF:   You have a fever.  You are leaking fluid from your vagina.  You have spotting or bleeding from your vagina.  You have severe abdominal cramping or pain.  You have rapid weight loss or gain.  You have shortness of breath with chest pain.  You notice  sudden or extreme swelling of your face, hands, ankles, feet, or legs.  You have not felt your baby move in over an hour.  You have severe headaches that do not go away with medicine.  You have vision changes. Document Released: 04/18/2001 Document Revised: 04/29/2013 Document Reviewed: 06/25/2012 [employer] Patient Information 2015 [employer]. This information is not intended to replace advice given to you by your health care provider. Make sure you discuss any questions you have with your health care provider.  Breastfeeding Deciding to breastfeed is one of the best choices you can make for you and your baby. A change in hormones during pregnancy causes your breast tissue to grow and increases the number and size of your milk ducts. These hormones also allow proteins, sugars, and fats from your blood supply to make breast milk in your milk-producing glands. Hormones prevent breast milk from being released before your baby is born as well as prompt milk flow after birth. Once breastfeeding has begun, thoughts of your baby, as well as his or her sucking or crying, can stimulate the release of milk from your milk-producing glands.  BENEFITS OF BREASTFEEDING For Your Baby  Your first milk (colostrum) helps your baby's digestive system function better.   There are antibodies in your milk that help your baby fight off infections.   Your baby has a lower incidence of asthma, allergies, and sudden infant death syndrome.   The nutrients in breast milk are better for your baby than infant formulas and are designed uniquely for your baby's needs.   Breast milk improves your baby's brain development.   Your baby is less likely to develop other conditions, such as childhood obesity, asthma, or type 2 diabetes mellitus.  For You   Breastfeeding helps to create a very special bond between you and your baby.   Breastfeeding is convenient. Breast milk is always available at the correct  temperature and costs nothing.   Breastfeeding helps to burn calories and helps you lose   the weight gained during pregnancy.   Breastfeeding makes your uterus contract to its prepregnancy size faster and slows bleeding (lochia) after you give birth.   Breastfeeding helps to lower your risk of developing type 2 diabetes mellitus, osteoporosis, and breast or ovarian cancer later in life. SIGNS THAT YOUR BABY IS HUNGRY Early Signs of Hunger  Increased alertness or activity.  Stretching.  Movement of the head from side to side.  Movement of the head and opening of the mouth when the corner of the mouth or cheek is stroked (rooting).  Increased sucking sounds, smacking lips, cooing, sighing, or squeaking.  Hand-to-mouth movements.  Increased sucking of fingers or hands. Late Signs of Hunger  Fussing.  Intermittent crying. Extreme Signs of Hunger Signs of extreme hunger will require calming and consoling before your baby will be able to breastfeed successfully. Do not wait for the following signs of extreme hunger to occur before you initiate breastfeeding:   Restlessness.  A loud, strong cry.   Screaming. BREASTFEEDING BASICS Breastfeeding Initiation  Find a comfortable place to sit or lie down, with your neck and back well supported.  Place a pillow or rolled up blanket under your baby to bring him or her to the level of your breast (if you are seated). Nursing pillows are specially designed to help support your arms and your baby while you breastfeed.  Make sure that your baby's abdomen is facing your abdomen.   Gently massage your breast. With your fingertips, massage from your chest wall toward your nipple in a circular motion. This encourages milk flow. You may need to continue this action during the feeding if your milk flows slowly.  Support your breast with 4 fingers underneath and your thumb above your nipple. Make sure your fingers are well away from your  nipple and your baby's mouth.   Stroke your baby's lips gently with your finger or nipple.   When your baby's mouth is open wide enough, quickly bring your baby to your breast, placing your entire nipple and as much of the colored area around your nipple (areola) as possible into your baby's mouth.   More areola should be visible above your baby's upper lip than below the lower lip.   Your baby's tongue should be between his or her lower gum and your breast.   Ensure that your baby's mouth is correctly positioned around your nipple (latched). Your baby's lips should create a seal on your breast and be turned out (everted).  It is common for your baby to suck about 2-3 minutes in order to start the flow of breast milk. Latching Teaching your baby how to latch on to your breast properly is very important. An improper latch can cause nipple pain and decreased milk supply for you and poor weight gain in your baby. Also, if your baby is not latched onto your nipple properly, he or she may swallow some air during feeding. This can make your baby fussy. Burping your baby when you switch breasts during the feeding can help to get rid of the air. However, teaching your baby to latch on properly is still the best way to prevent fussiness from swallowing air while breastfeeding. Signs that your baby has successfully latched on to your nipple:    Silent tugging or silent sucking, without causing you pain.   Swallowing heard between every 3-4 sucks.    Muscle movement above and in front of his or her ears while sucking.  Signs that your   baby has not successfully latched on to nipple:   Sucking sounds or smacking sounds from your baby while breastfeeding.  Nipple pain. If you think your baby has not latched on correctly, slip your finger into the corner of your baby's mouth to break the suction and place it between your baby's gums. Attempt breastfeeding initiation again. Signs of Successful  Breastfeeding Signs from your baby:   A gradual decrease in the number of sucks or complete cessation of sucking.   Falling asleep.   Relaxation of his or her body.   Retention of a small amount of milk in his or her mouth.   Letting go of your breast by himself or herself. Signs from you:  Breasts that have increased in firmness, weight, and size 1-3 hours after feeding.   Breasts that are softer immediately after breastfeeding.  Increased milk volume, as well as a change in milk consistency and color by the fifth day of breastfeeding.   Nipples that are not sore, cracked, or bleeding. Signs That Your Baby is Getting Enough Milk  Wetting at least 3 diapers in a 24-hour period. The urine should be clear and pale yellow by age 5 days.  At least 3 stools in a 24-hour period by age 5 days. The stool should be soft and yellow.  At least 3 stools in a 24-hour period by age 7 days. The stool should be seedy and yellow.  No loss of weight greater than 10% of birth weight during the first 3 days of age.  Average weight gain of 4-7 ounces (113-198 g) per week after age 4 days.  Consistent daily weight gain by age 5 days, without weight loss after the age of 2 weeks. After a feeding, your baby may spit up a small amount. This is common. BREASTFEEDING FREQUENCY AND DURATION Frequent feeding will help you make more milk and can prevent sore nipples and breast engorgement. Breastfeed when you feel the need to reduce the fullness of your breasts or when your baby shows signs of hunger. This is called "breastfeeding on demand." Avoid introducing a pacifier to your baby while you are working to establish breastfeeding (the first 4-6 weeks after your baby is born). After this time you may choose to use a pacifier. Research has shown that pacifier use during the first year of a baby's life decreases the risk of sudden infant death syndrome (SIDS). Allow your baby to feed on each breast as  long as he or she wants. Breastfeed until your baby is finished feeding. When your baby unlatches or falls asleep while feeding from the first breast, offer the second breast. Because newborns are often sleepy in the first few weeks of life, you may need to awaken your baby to get him or her to feed. Breastfeeding times will vary from baby to baby. However, the following rules can serve as a guide to help you ensure that your baby is properly fed:  Newborns (babies 4 weeks of age or younger) may breastfeed every 1-3 hours.  Newborns should not go longer than 3 hours during the day or 5 hours during the night without breastfeeding.  You should breastfeed your baby a minimum of 8 times in a 24-hour period until you begin to introduce solid foods to your baby at around 6 months of age. BREAST MILK PUMPING Pumping and storing breast milk allows you to ensure that your baby is exclusively fed your breast milk, even at times when you are unable to   breastfeed. This is especially important if you are going back to work while you are still breastfeeding or when you are not able to be present during feedings. Your lactation consultant can give you guidelines on how long it is safe to store breast milk.  A breast pump is a machine that allows you to pump milk from your breast into a sterile bottle. The pumped breast milk can then be stored in a refrigerator or freezer. Some breast pumps are operated by hand, while others use electricity. Ask your lactation consultant which type will work best for you. Breast pumps can be purchased, but some hospitals and breastfeeding support groups lease breast pumps on a monthly basis. A lactation consultant can teach you how to hand express breast milk, if you prefer not to use a pump.  CARING FOR YOUR BREASTS WHILE YOU BREASTFEED Nipples can become dry, cracked, and sore while breastfeeding. The following recommendations can help keep your breasts moisturized and  healthy:  Avoid using soap on your nipples.   Wear a supportive bra. Although not required, special nursing bras and tank tops are designed to allow access to your breasts for breastfeeding without taking off your entire bra or top. Avoid wearing underwire-style bras or extremely tight bras.  Air dry your nipples for 3-4minutes after each feeding.   Use only cotton bra pads to absorb leaked breast milk. Leaking of breast milk between feedings is normal.   Use lanolin on your nipples after breastfeeding. Lanolin helps to maintain your skin's normal moisture barrier. If you use pure lanolin, you do not need to wash it off before feeding your baby again. Pure lanolin is not toxic to your baby. You may also hand express a few drops of breast milk and gently massage that milk into your nipples and allow the milk to air dry. In the first few weeks after giving birth, some women experience extremely full breasts (engorgement). Engorgement can make your breasts feel heavy, warm, and tender to the touch. Engorgement peaks within 3-5 days after you give birth. The following recommendations can help ease engorgement:  Completely empty your breasts while breastfeeding or pumping. You may want to start by applying warm, moist heat (in the shower or with warm water-soaked hand towels) just before feeding or pumping. This increases circulation and helps the milk flow. If your baby does not completely empty your breasts while breastfeeding, pump any extra milk after he or she is finished.  Wear a snug bra (nursing or regular) or tank top for 1-2 days to signal your body to slightly decrease milk production.  Apply ice packs to your breasts, unless this is too uncomfortable for you.  Make sure that your baby is latched on and positioned properly while breastfeeding. If engorgement persists after 48 hours of following these recommendations, contact your health care provider or a lactation consultant. OVERALL  HEALTH CARE RECOMMENDATIONS WHILE BREASTFEEDING  Eat healthy foods. Alternate between meals and snacks, eating 3 of each per day. Because what you eat affects your breast milk, some of the foods may make your baby more irritable than usual. Avoid eating these foods if you are sure that they are negatively affecting your baby.  Drink milk, fruit juice, and water to satisfy your thirst (about 10 glasses a day).   Rest often, relax, and continue to take your prenatal vitamins to prevent fatigue, stress, and anemia.  Continue breast self-awareness checks.  Avoid chewing and smoking tobacco.  Avoid alcohol and drug use.   Some medicines that may be harmful to your baby can pass through breast milk. It is important to ask your health care provider before taking any medicine, including all over-the-counter and prescription medicine as well as vitamin and herbal supplements. It is possible to become pregnant while breastfeeding. If birth control is desired, ask your health care provider about options that will be safe for your baby. SEEK MEDICAL CARE IF:   You feel like you want to stop breastfeeding or have become frustrated with breastfeeding.  You have painful breasts or nipples.  Your nipples are cracked or bleeding.  Your breasts are red, tender, or warm.  You have a swollen area on either breast.  You have a fever or chills.  You have nausea or vomiting.  You have drainage other than breast milk from your nipples.  Your breasts do not become full before feedings by the fifth day after you give birth.  You feel sad and depressed.  Your baby is too sleepy to eat well.  Your baby is having trouble sleeping.   Your baby is wetting less than 3 diapers in a 24-hour period.  Your baby has less than 3 stools in a 24-hour period.  Your baby's skin or the white part of his or her eyes becomes yellow.   Your baby is not gaining weight by 5 days of age. SEEK IMMEDIATE MEDICAL CARE  IF:   Your baby is overly tired (lethargic) and does not want to wake up and feed.  Your baby develops an unexplained fever. Document Released: 04/24/2005 Document Revised: 04/29/2013 Document Reviewed: 10/16/2012 ExitCare Patient Information 2015 [employer]. This information is not intended to replace advice given to you by your health care provider. Make sure you discuss any questions you have with your health care provider.  

## 2014-02-20 NOTE — Progress Notes (Signed)
FBS 96-109 (all out of range) 2 hour pp 88-151 (5 of 30 out of range) Growth shows 4 lb 13 ozs and 17% with AC at 6%--discussed with MFM--she does not need testing at this point.

## 2014-02-21 LAB — GC/CHLAMYDIA PROBE AMP
CT Probe RNA: NEGATIVE
GC Probe RNA: NEGATIVE

## 2014-02-24 ENCOUNTER — Encounter: Payer: Self-pay | Admitting: Family Medicine

## 2014-02-24 LAB — CULTURE, BETA STREP (GROUP B ONLY)

## 2014-02-25 ENCOUNTER — Inpatient Hospital Stay (HOSPITAL_COMMUNITY)
Admission: AD | Admit: 2014-02-25 | Discharge: 2014-02-26 | Disposition: A | Discharge status: Home / Self Care | Payer: 59 | Source: Ambulatory Visit | Attending: Obstetrics & Gynecology | Admitting: Obstetrics & Gynecology

## 2014-02-25 DIAGNOSIS — Z3403 Encounter for supervision of normal first pregnancy, third trimester: Secondary | ICD-10-CM

## 2014-02-25 DIAGNOSIS — Z3A36 36 weeks gestation of pregnancy: Secondary | ICD-10-CM | POA: Insufficient documentation

## 2014-02-25 DIAGNOSIS — O24419 Gestational diabetes mellitus in pregnancy, unspecified control: Secondary | ICD-10-CM

## 2014-02-25 DIAGNOSIS — Z87891 Personal history of nicotine dependence: Secondary | ICD-10-CM | POA: Insufficient documentation

## 2014-02-25 DIAGNOSIS — N200 Calculus of kidney: Secondary | ICD-10-CM

## 2014-02-26 ENCOUNTER — Encounter (HOSPITAL_COMMUNITY): Payer: Self-pay | Admitting: *Deleted

## 2014-02-26 DIAGNOSIS — Z3A36 36 weeks gestation of pregnancy: Secondary | ICD-10-CM | POA: Diagnosis not present

## 2014-02-26 DIAGNOSIS — Z87891 Personal history of nicotine dependence: Secondary | ICD-10-CM | POA: Diagnosis not present

## 2014-02-26 NOTE — MAU Note (Signed)
Pt states she  Started having contractions around 1930 -2000. Contractions started getting closer q135min

## 2014-02-26 NOTE — MAU Provider Note (Signed)
History     CSN: [id number]  Arrival date and time: 02/25/14 2341   None     Chief Complaint  Patient presents with  . Labor Eval   HPI  Jenna Leblanc is a 33 y.o. G2P0010 at 62w5dpresents with contractions every 5 minutes for 4 hours prior to arrival in MAU. No LOF or vaginal bleeding. Good fetal movement.   No headache or vision changes. No abdominal pain. No fevers or chills.    OB History   Grav Para Term Preterm Abortions TAB SAB Ect Mult Living   _0 Past Medical History  Diagnosis Date  . Kidney stones   . Gestational diabetes mellitus, antepartum     Past Surgical History  Procedure Laterality Date  . Lithotripsy      Family History  Problem Relation Age of Onset  . Cancer Sister     sarcoma in the uterus  . Cancer Maternal Grandmother     lung  . Diabetes Maternal Grandmother   . Heart attack Maternal Grandfather   . Pneumonia Paternal Grandmother   . Diabetes Paternal Grandmother   . Cancer Paternal Grandfather     ?spine    History  Substance Use Topics  . Smoking status: Former Smoker    Quit date: 07/15/2006  . Smokeless tobacco: Never Used  . Alcohol Use: Yes     Comment: 2-3 times per week    Allergies:  Allergies  Allergen Reactions  . Penicillins Rash    Prescriptions prior to admission  Medication Sig Dispense Refill  . prenatal vitamin w/FE, FA (PRENATAL 1 + 1) 27-1 MG TABS tablet Take 1 tablet by mouth daily at 12 noon.      .Marland KitchenACCU-CHEK FASTCLIX LANCETS MISC 1 Units by Does not apply route 4 (four) times daily.  102 each  3  . Blood Glucose Monitoring Suppl (ACCU-CHEK NANO SMARTVIEW) W/DEVICE KIT 1 Device by Does not apply route 4 (four) times daily.  1 kit  0  . glucose blood (ACCU-CHEK SMARTVIEW) test strip Please check blood sugar 4 times daily (fasting and 2 hours after each meal)  51 each  12  . hydrocortisone (ANUSOL-HC) 2.5 % rectal cream Place 1 application rectally 2 (two) times daily.  30 g  0   . valACYclovir (VALTREX) 500 MG tablet Take 500 mg by mouth 2 (two) times daily.        Review of Systems  All other systems reviewed and are negative.  Physical Exam   Blood pressure 112/61, pulse 68, resp. rate 16, last menstrual period 05/22/2013.  Physical Exam  Constitutional: She is oriented to person, place, and time. She appears well-developed and well-nourished. No distress.  HENT:  Head: Normocephalic and atraumatic.  Eyes: EOM are normal.  Neck: Normal range of motion.  Cardiovascular: Normal rate, regular rhythm and normal heart sounds.   No murmur heard. Respiratory: Effort normal and breath sounds normal. No respiratory distress. She has no wheezes.  GI: Soft. There is no tenderness. There is no rebound.  Uterine size appropriate for gestational age   Musculoskeletal: Normal range of motion.  Neurological: She is alert and oriented to person, place, and time.  Skin: Skin is warm and dry.  Dilation: 1.5 Effacement (%): 80 Station: +1;0 Exam by:: C. Parker FHT:  FHR: 155 bpm, variability: moderate,  accelerations:  present,  decelerations:  none UC: Irregular, every 3-7 minutes  Procedures-None  Assessment and Plan  A: 4w5dIUP with contractions. No cervical change appreciated on exam. FHT category 1.  P: Will discharge home in stable condition for routine prenatal follow up Labor precautions and kickcounts reviewed.   PDimas Chyle10/22/2015, 1:54 AM   I have participated in the care of this patient and I agree with the above. SSerita GrammesCNM 9:29 AM 02/26/2014

## 2014-02-26 NOTE — Discharge Instructions (Signed)
Braxton Hicks Contractions °Contractions of the uterus can occur throughout pregnancy. Contractions are not always a sign that you are in labor.  °WHAT ARE BRAXTON HICKS CONTRACTIONS?  °Contractions that occur before labor are called Braxton Hicks contractions, or false labor. Toward the end of pregnancy (32-34 weeks), these contractions can develop more often and may become more forceful. This is not true labor because these contractions do not result in opening (dilatation) and thinning of the cervix. They are sometimes difficult to tell apart from true labor because these contractions can be forceful and people have different pain tolerances. You should not feel embarrassed if you go to the hospital with false labor. Sometimes, the only way to tell if you are in true labor is for your health care provider to look for changes in the cervix. °If there are no prenatal problems or other health problems associated with the pregnancy, it is completely safe to be sent home with false labor and await the onset of true labor. °HOW CAN YOU TELL THE DIFFERENCE BETWEEN TRUE AND FALSE LABOR? °False Labor °· The contractions of false labor are usually shorter and not as hard as those of true labor.   °· The contractions are usually irregular.   °· The contractions are often felt in the front of the lower abdomen and in the groin.   °· The contractions may go away when you walk around or change positions while lying down.   °· The contractions get weaker and are shorter lasting as time goes on.   °· The contractions do not usually become progressively stronger, regular, and closer together as with true labor.   °True Labor °· Contractions in true labor last 30-70 seconds, become very regular, usually become more intense, and increase in frequency.   °· The contractions do not go away with walking.   °· The discomfort is usually felt in the top of the uterus and spreads to the lower abdomen and low back.   °· True labor can be  determined by your health care provider with an exam. This will show that the cervix is dilating and getting thinner.   °WHAT TO REMEMBER °· Keep up with your usual exercises and follow other instructions given by your health care provider.   °· Take medicines as directed by your health care provider.   °· Keep your regular prenatal appointments.   °· Eat and drink lightly if you think you are going into labor.   °· If Braxton Hicks contractions are making you uncomfortable:   °¨ Change your position from lying down or resting to walking, or from walking to resting.   °¨ Sit and rest in a tub of warm water.   °¨ Drink 2-3 glasses of water. Dehydration may cause these contractions.   °¨ Do slow and deep breathing several times an hour.   °WHEN SHOULD I SEEK IMMEDIATE MEDICAL CARE? °Seek immediate medical care if: °· Your contractions become stronger, more regular, and closer together.   °· You have fluid leaking or gushing from your vagina.   °· You have a fever.   °· You pass blood-tinged mucus.   °· You have vaginal bleeding.   °· You have continuous abdominal pain.   °· You have low back pain that you never had before.   °· You feel your baby's head pushing down and causing pelvic pressure.   °· Your baby is not moving as much as it used to.   °Document Released: 04/24/2005 Document Revised: 04/29/2013 Document Reviewed: 02/03/2013 °ExitCare® Patient Information ©2015 [employer]. This information is not intended to replace advice given to you by your health care   provider. Make sure you discuss any questions you have with your health care provider. ° °

## 2014-02-27 ENCOUNTER — Encounter: Payer: Self-pay | Admitting: Obstetrics & Gynecology

## 2014-02-27 ENCOUNTER — Ambulatory Visit (INDEPENDENT_AMBULATORY_CARE_PROVIDER_SITE_OTHER): Payer: 59 | Admitting: Obstetrics & Gynecology

## 2014-02-27 VITALS — BP 112/69 | HR 76 | Wt 160.4 lb

## 2014-02-27 DIAGNOSIS — Z3403 Encounter for supervision of normal first pregnancy, third trimester: Secondary | ICD-10-CM

## 2014-02-27 MED ORDER — VALACYCLOVIR HCL 500 MG PO TABS: 500.0000 mg | ORAL_TABLET | Freq: Two times a day (BID) | ORAL | Status: DC

## 2014-02-27 NOTE — Progress Notes (Signed)
Last labs have been done.  Recently checked at MAU on 10/21.

## 2014-02-27 NOTE — Addendum Note (Signed)
Addended by: Allie BossierVE, Britley Gashi C on: 02/27/2014 09:12 AM   Modules accepted: Orders

## 2014-02-27 NOTE — Progress Notes (Signed)
Routine visit. She reports that her fbs are always less than 103. 2 hours are usually about 103-119. Good FM. Labor precautions. She declines a digital exam.

## 2014-03-06 ENCOUNTER — Ambulatory Visit (INDEPENDENT_AMBULATORY_CARE_PROVIDER_SITE_OTHER): Payer: 59 | Admitting: Obstetrics & Gynecology

## 2014-03-06 VITALS — BP 115/66 | HR 82 | Wt 160.0 lb

## 2014-03-06 DIAGNOSIS — O0993 Supervision of high risk pregnancy, unspecified, third trimester: Secondary | ICD-10-CM

## 2014-03-06 DIAGNOSIS — O24419 Gestational diabetes mellitus in pregnancy, unspecified control: Secondary | ICD-10-CM

## 2014-03-06 NOTE — Progress Notes (Signed)
Fasting values are slightly elevated, normal postprandials. Very favorable cervix 3/80/+1, small amount of bleeding.  Labor and fetal movement precautions reviewed.

## 2014-03-06 NOTE — Progress Notes (Signed)
Has noticed babies movements a little less, she feels her balling up.

## 2014-03-06 NOTE — Patient Instructions (Signed)
Return to clinic for any obstetric concerns or go to [facility] for evaluation  

## 2014-03-09 ENCOUNTER — Encounter: Payer: Self-pay | Admitting: Obstetrics & Gynecology

## 2014-03-13 ENCOUNTER — Ambulatory Visit (INDEPENDENT_AMBULATORY_CARE_PROVIDER_SITE_OTHER): Payer: 59 | Admitting: Obstetrics & Gynecology

## 2014-03-13 ENCOUNTER — Encounter (HOSPITAL_COMMUNITY): Payer: Self-pay | Admitting: *Deleted

## 2014-03-13 ENCOUNTER — Telehealth (HOSPITAL_COMMUNITY): Payer: Self-pay | Admitting: *Deleted

## 2014-03-13 ENCOUNTER — Encounter: Payer: Self-pay | Admitting: Obstetrics & Gynecology

## 2014-03-13 VITALS — BP 131/73 | HR 82 | Wt 163.0 lb

## 2014-03-13 DIAGNOSIS — O24410 Gestational diabetes mellitus in pregnancy, diet controlled: Secondary | ICD-10-CM

## 2014-03-13 DIAGNOSIS — O0993 Supervision of high risk pregnancy, unspecified, third trimester: Secondary | ICD-10-CM

## 2014-03-13 NOTE — Progress Notes (Signed)
0730. Sugars ok, slightly elevated.

## 2014-03-13 NOTE — Progress Notes (Signed)
Routine visit. Good FM. Lungs- CTAB. Labor precautions reviewed. No problems.

## 2014-03-13 NOTE — Progress Notes (Signed)
IOL scheduled for 03-21-14 at

## 2014-03-13 NOTE — Telephone Encounter (Signed)
Preadmission screen  

## 2014-03-13 NOTE — Progress Notes (Signed)
Has a productive cough.

## 2014-03-14 ENCOUNTER — Inpatient Hospital Stay (HOSPITAL_COMMUNITY)
Admission: AD | Admit: 2014-03-14 | Discharge: 2014-03-17 | DRG: 775 | Disposition: A | Discharge status: Home / Self Care | Payer: 59 | Source: Ambulatory Visit | Attending: Obstetrics and Gynecology | Admitting: Obstetrics and Gynecology

## 2014-03-14 DIAGNOSIS — O24420 Gestational diabetes mellitus in childbirth, diet controlled: Secondary | ICD-10-CM | POA: Diagnosis present

## 2014-03-14 DIAGNOSIS — Z87442 Personal history of urinary calculi: Secondary | ICD-10-CM

## 2014-03-14 DIAGNOSIS — Z87891 Personal history of nicotine dependence: Secondary | ICD-10-CM

## 2014-03-14 DIAGNOSIS — Z8249 Family history of ischemic heart disease and other diseases of the circulatory system: Secondary | ICD-10-CM

## 2014-03-14 DIAGNOSIS — Z833 Family history of diabetes mellitus: Secondary | ICD-10-CM

## 2014-03-14 DIAGNOSIS — IMO0001 Reserved for inherently not codable concepts without codable children: Secondary | ICD-10-CM

## 2014-03-14 DIAGNOSIS — O471 False labor at or after 37 completed weeks of gestation: Secondary | ICD-10-CM | POA: Diagnosis present

## 2014-03-14 DIAGNOSIS — Z3A39 39 weeks gestation of pregnancy: Secondary | ICD-10-CM | POA: Diagnosis present

## 2014-03-14 LAB — CBC
HEMATOCRIT: 38.4 % (ref 36.0–46.0)
HEMOGLOBIN: 13 g/dL (ref 12.0–15.0)
MCH: 28.8 pg (ref 26.0–34.0)
MCHC: 33.9 g/dL (ref 30.0–36.0)
MCV: 85 fL (ref 78.0–100.0)
Platelets: 145 10*3/uL — ABNORMAL LOW (ref 150–400)
RBC: 4.52 MIL/uL (ref 3.87–5.11)
RDW: 14.2 % (ref 11.5–15.5)
WBC: 11.3 10*3/uL — ABNORMAL HIGH (ref 4.0–10.5)

## 2014-03-14 MED ORDER — FENTANYL BOLUS VIA INFUSION
100.0000 ug | INTRAVENOUS | Status: DC | PRN
  Filled 2014-03-14: qty 100

## 2014-03-14 MED ORDER — LIDOCAINE HCL (PF) 1 % IJ SOLN
30.0000 mL | INTRAMUSCULAR | Status: DC | PRN
  Filled 2014-03-14: qty 30

## 2014-03-14 MED ORDER — LACTATED RINGERS IV SOLN
500.0000 mL | INTRAVENOUS | Status: DC | PRN
  Administered 2014-03-15: 500 mL via INTRAVENOUS

## 2014-03-14 MED ORDER — OXYCODONE-ACETAMINOPHEN 5-325 MG PO TABS: 1.0000 | ORAL_TABLET | ORAL | Status: DC | PRN

## 2014-03-14 MED ORDER — ACETAMINOPHEN 325 MG PO TABS: 650.0000 mg | ORAL_TABLET | ORAL | Status: DC | PRN

## 2014-03-14 MED ORDER — FENTANYL CITRATE 0.05 MG/ML IJ SOLN
INTRAMUSCULAR | Status: AC
  Filled 2014-03-14: qty 2

## 2014-03-14 MED ORDER — FLEET ENEMA 7-19 GM/118ML RE ENEM: 1.0000 | ENEMA | RECTAL | Status: DC | PRN

## 2014-03-14 MED ORDER — LACTATED RINGERS IV SOLN
INTRAVENOUS | Status: DC
  Administered 2014-03-14 – 2014-03-15 (×2): via INTRAVENOUS

## 2014-03-14 MED ORDER — CITRIC ACID-SODIUM CITRATE 334-500 MG/5ML PO SOLN
30.0000 mL | ORAL | Status: DC | PRN
Start: 2014-03-14 — End: 2014-03-15
  Administered 2014-03-15: 30 mL via ORAL
  Filled 2014-03-14: qty 15

## 2014-03-14 MED ORDER — OXYCODONE-ACETAMINOPHEN 5-325 MG PO TABS
2.0000 | ORAL_TABLET | ORAL | Status: DC | PRN
Start: 2014-03-14 — End: 2014-03-15

## 2014-03-14 MED ORDER — OXYTOCIN BOLUS FROM INFUSION
500.0000 mL | INTRAVENOUS | Status: DC
  Administered 2014-03-15: 500 mL via INTRAVENOUS

## 2014-03-14 MED ORDER — ONDANSETRON HCL 4 MG/2ML IJ SOLN: 4.0000 mg | Freq: Four times a day (QID) | INTRAMUSCULAR | Status: DC | PRN

## 2014-03-14 MED ORDER — OXYTOCIN 40 UNITS IN LACTATED RINGERS INFUSION - SIMPLE MED
62.5000 mL/h | INTRAVENOUS | Status: DC
Start: 2014-03-14 — End: 2014-03-15
  Filled 2014-03-14: qty 1000

## 2014-03-14 NOTE — MAU Note (Signed)
Leaking fld since 2030. Clear with some pink noted. Contractions started after leaking. Was 4-5cm in office yesterday

## 2014-03-14 NOTE — H&P (Signed)
LABOR ADMISSION HISTORY AND PHYSICAL  Jenna Leblanc is a 33 y.o. female G2P0010 with IUP at 29w0dby [place] contractions and SROM. Pt presents w/ ctx and SROM since apx 2 hours. She reports +FMs,, no VB, no blurry vision, headaches or peripheral edema, and RUQ pain. She is uncertain if she desires an epidural for labor pain control. She plans on breastfeeding.  Dating: By [place]--->  Estimated Date of Delivery: 03/21/14  Sono:   _0 , abnormal anatomy, 37%EFW _1 , CWD, normal anatomy, cephalic presentation, 121%EFW  Prenatal History/Complications:  Past Medical History: Past Medical History  Diagnosis Date  . Gestational diabetes mellitus, antepartum   . Gestational diabetes     diet controlled  . Kidney stones     none in 8 years    Past Surgical History: Past Surgical History  Procedure Laterality Date  . Lithotripsy      Obstetrical History: OB History    Gravida Para Term Preterm AB TAB SAB Ectopic Multiple Living   _2 Social History: History   Social History  . Marital Status: Married    Spouse Name: N/A    Number of Children: N/A  . Years of Education: N/A   Social History Main Topics  . Smoking status: Former Smoker    Quit date: 07/15/2006  . Smokeless tobacco: Never Used  . Alcohol Use: Yes     Comment: 2-3 times per week  . Drug Use: No  . Sexual Activity: Yes    Birth Control/ Protection: None   Other Topics Concern  . Not on file   Social History Narrative    Family History: Family History  Problem Relation Age of Onset  . Cancer Sister     sarcoma in the uterus  . Cancer Maternal Grandmother     lung  . Diabetes Maternal Grandmother   . Heart attack Maternal Grandfather   . Pneumonia Paternal Grandmother   . Diabetes Paternal Grandmother   . Cancer Paternal Grandfather     ?spine    Allergies: Allergies  Allergen Reactions  . Penicillins Rash    Prescriptions prior to admission  Medication  Sig Dispense Refill Last Dose  . calcium carbonate (TUMS - DOSED IN MG ELEMENTAL CALCIUM) 500 MG chewable tablet Chew 2 tablets by mouth 2 (two) times daily as needed for indigestion or heartburn.   03/14/2014 at Unknown time  . dextromethorphan-guaiFENesin (MUCINEX DM) 30-600 MG per 12 hr tablet Take 1 tablet by mouth 2 (two) times daily as needed for cough (and congestion).   Past Week at Unknown time  . prenatal vitamin w/FE, FA (PRENATAL 1 + 1) 27-1 MG TABS tablet Take 1 tablet by mouth daily.    03/14/2014 at Unknown time  . ACCU-CHEK FASTCLIX LANCETS MISC 1 Units by Does not apply route 4 (four) times daily. 102 each 3 Taking  . Blood Glucose Monitoring Suppl (ACCU-CHEK NANO SMARTVIEW) W/DEVICE KIT 1 Device by Does not apply route 4 (four) times daily. 1 kit 0 Taking  . glucose blood (ACCU-CHEK SMARTVIEW) test strip Please check blood sugar 4 times daily (fasting and 2 hours after each meal) 51 each 12 Taking  . hydrocortisone (ANUSOL-HC) 2.5 % rectal cream Place 1 application rectally 2 (two) times daily. (Patient not taking: Reported on 03/14/2014) 30 g 0 Taking  . valACYclovir (VALTREX) 500 MG tablet Take 1 tablet (500 mg total) by mouth 2 (  two) times daily. (Patient taking differently: Take 500 mg by mouth 2 (two) times daily as needed (for cold sores). ) 60 tablet 12 unknown at unknown     Review of Systems   All systems reviewed and negative except as stated in HPI  Blood pressure 121/67, pulse 67, temperature 98.1 F (36.7 C), temperature source Oral, resp. rate 18, height 5' 2.5" (1.588 m), weight 73.936 kg (163 lb), last menstrual period 05/22/2013, SpO2 95 %. General appearance: alert, cooperative and no distress Lungs: clear to auscultation bilaterally Heart: regular rate and rhythm Abdomen: soft, non-tender; bowel sounds normal Extremities: Homans sign is negative, no sign of DVT DTR's normal Presentation: cephalic Fetal monitoringBaseline: 140 bpm, Variability: Good {> 6  bpm), Accelerations: Reactive and Decelerations: Absent Uterine activityDate/time of onset: 2030, Frequency: Every 4 minutes and Duration: 45 seconds Dilation: 4 Exam by:: Derrill Kay, MD    Prenatal labs: ABO, Rh: O/POS/-- (03/09 1128) Antibody: NEG (03/09 1128) Rubella:   RPR: NON REAC (08/21 0955)  HBsAg: NEGATIVE (03/09 1128)  HIV: NONREACTIVE (08/21 0955)  GBS: Negative (10/16 0000)  1 hr Glucola Positive (3hr GTT positive) Genetic screening  normal Anatomy US 1st abnormal, 2nd normal.   Prenatal Transfer Tool  Maternal Diabetes: Yes:  Diabetes Type:  Diet controlled Genetic Screening: Normal Maternal Ultrasounds/Referrals: Normal Fetal Ultrasounds or other Referrals:  Referred to Materal Fetal Medicine , Other: BPP 8/8 Maternal Substance Abuse:  No Significant Maternal Medications:  None Significant Maternal Lab Results: None     No results found for this or any previous visit (from the past 24 hour(s)).  Patient Active Problem List   Diagnosis Date Noted  . Active labor at term 03/14/2014  . Gestational diabetes mellitus, antepartum 01/09/2014  . Supervision of high risk pregnancy, antepartum 08/01/2013  . Nephrolithiasis 08/01/2013    Assessment: Jenna Leblanc is a 33 y.o. G2P0010 at 10w0dhere for ctx and SROM. 33y.o. _0 @ at 342w0dy [place] I strip.  #Labor: --Admit to birthing suites --IV access --Thin clear diet  #Pain:  --Epidural if desired --Fentanyl [id number] as desired  #FWB:  --Category I strip --Continuous fetal monitoring  #ID:   --GBS negative  #MOF:  --Breast    KocCollene Gobble/11/2013, 10:51 PM  I spoke with and examined patient and agree with resident/PA/SNM's note and plan of care.  A1DM well controlled, EFW 17% @ 34wks. Check q 4hr cbg's early/latent phase, then q 2hr cbg's active phase. SROM clear fluid around 2030. Expectant management for now.  KimRoma SchanzNM, [facility]Over[facility]12/2013 6:09 AM

## 2014-03-14 NOTE — Progress Notes (Signed)
Betsy [facility] in [facility] called and pt to BS from Triage for further eval of SROM. PT and spouse agree with plan of care

## 2014-03-15 ENCOUNTER — Encounter (HOSPITAL_COMMUNITY): Payer: Self-pay | Admitting: Anesthesiology

## 2014-03-15 ENCOUNTER — Inpatient Hospital Stay (HOSPITAL_COMMUNITY): Payer: 59 | Admitting: Anesthesiology

## 2014-03-15 DIAGNOSIS — O24420 Gestational diabetes mellitus in childbirth, diet controlled: Secondary | ICD-10-CM

## 2014-03-15 DIAGNOSIS — Z3A39 39 weeks gestation of pregnancy: Secondary | ICD-10-CM

## 2014-03-15 DIAGNOSIS — O4202 Full-term premature rupture of membranes, onset of labor within 24 hours of rupture: Secondary | ICD-10-CM

## 2014-03-15 LAB — TYPE AND SCREEN
ABO/RH(D): O POS
ANTIBODY SCREEN: NEGATIVE

## 2014-03-15 LAB — GLUCOSE, CAPILLARY
GLUCOSE-CAPILLARY: 116 mg/dL — AB (ref 70–99)
Glucose-Capillary: 98 mg/dL (ref 70–99)

## 2014-03-15 LAB — ABO/RH: ABO/RH(D): O POS

## 2014-03-15 MED ORDER — TETANUS-DIPHTH-ACELL PERTUSSIS 5-2.5-18.5 LF-MCG/0.5 IM SUSP: 0.5000 mL | Freq: Once | INTRAMUSCULAR | Status: DC

## 2014-03-15 MED ORDER — ZOLPIDEM TARTRATE 5 MG PO TABS: 5.0000 mg | ORAL_TABLET | Freq: Every evening | ORAL | Status: DC | PRN

## 2014-03-15 MED ORDER — FAMOTIDINE IN NACL 20-0.9 MG/50ML-% IV SOLN
20.0000 mg | Freq: Once | INTRAVENOUS | Status: AC
  Administered 2014-03-15: 20 mg via INTRAVENOUS
  Filled 2014-03-15: qty 50

## 2014-03-15 MED ORDER — WITCH HAZEL-GLYCERIN EX PADS: 1.0000 "application " | MEDICATED_PAD | CUTANEOUS | Status: DC | PRN

## 2014-03-15 MED ORDER — EPHEDRINE 5 MG/ML INJ
10.0000 mg | INTRAVENOUS | Status: DC | PRN
  Filled 2014-03-15: qty 2

## 2014-03-15 MED ORDER — PRENATAL MULTIVITAMIN CH
1.0000 | ORAL_TABLET | Freq: Every day | ORAL | Status: DC
  Administered 2014-03-15 – 2014-03-17 (×3): 1 via ORAL
  Filled 2014-03-15 (×3): qty 1

## 2014-03-15 MED ORDER — FENTANYL 2.5 MCG/ML BUPIVACAINE 1/10 % EPIDURAL INFUSION (WH - ANES)
INTRAMUSCULAR | Status: AC
  Filled 2014-03-15: qty 125

## 2014-03-15 MED ORDER — FENTANYL 2.5 MCG/ML BUPIVACAINE 1/10 % EPIDURAL INFUSION (WH - ANES)
INTRAMUSCULAR | Status: DC | PRN
  Administered 2014-03-15: 14 mL/h via EPIDURAL

## 2014-03-15 MED ORDER — ONDANSETRON HCL 4 MG PO TABS: 4.0000 mg | ORAL_TABLET | ORAL | Status: DC | PRN

## 2014-03-15 MED ORDER — SIMETHICONE 80 MG PO CHEW: 80.0000 mg | CHEWABLE_TABLET | ORAL | Status: DC | PRN

## 2014-03-15 MED ORDER — FENTANYL CITRATE 0.05 MG/ML IJ SOLN
100.0000 ug | INTRAMUSCULAR | Status: DC | PRN
  Administered 2014-03-15: 100 ug via INTRAVENOUS
  Filled 2014-03-15: qty 2

## 2014-03-15 MED ORDER — IBUPROFEN 600 MG PO TABS
600.0000 mg | ORAL_TABLET | Freq: Four times a day (QID) | ORAL | Status: DC
  Administered 2014-03-15 – 2014-03-17 (×8): 600 mg via ORAL
  Filled 2014-03-15 (×9): qty 1

## 2014-03-15 MED ORDER — DIPHENHYDRAMINE HCL 50 MG/ML IJ SOLN: 12.5000 mg | INTRAMUSCULAR | Status: DC | PRN

## 2014-03-15 MED ORDER — LIDOCAINE HCL (PF) 1 % IJ SOLN
INTRAMUSCULAR | Status: DC | PRN
  Administered 2014-03-15 (×2): 4 mL

## 2014-03-15 MED ORDER — FENTANYL 2.5 MCG/ML BUPIVACAINE 1/10 % EPIDURAL INFUSION (WH - ANES)
14.0000 mL/h | INTRAMUSCULAR | Status: DC | PRN
  Administered 2014-03-15: 14 mL/h via EPIDURAL

## 2014-03-15 MED ORDER — PHENYLEPHRINE 40 MCG/ML (10ML) SYRINGE FOR IV PUSH (FOR BLOOD PRESSURE SUPPORT)
80.0000 ug | PREFILLED_SYRINGE | INTRAVENOUS | Status: DC | PRN
  Filled 2014-03-15: qty 2

## 2014-03-15 MED ORDER — SENNOSIDES-DOCUSATE SODIUM 8.6-50 MG PO TABS
2.0000 | ORAL_TABLET | ORAL | Status: DC
  Administered 2014-03-16 – 2014-03-17 (×2): 2 via ORAL
  Filled 2014-03-15 (×2): qty 2

## 2014-03-15 MED ORDER — ONDANSETRON HCL 4 MG/2ML IJ SOLN: 4.0000 mg | INTRAMUSCULAR | Status: DC | PRN

## 2014-03-15 MED ORDER — PHENYLEPHRINE 40 MCG/ML (10ML) SYRINGE FOR IV PUSH (FOR BLOOD PRESSURE SUPPORT)
PREFILLED_SYRINGE | INTRAVENOUS | Status: AC
  Filled 2014-03-15: qty 10

## 2014-03-15 MED ORDER — LACTATED RINGERS IV SOLN
500.0000 mL | Freq: Once | INTRAVENOUS | Status: AC
  Administered 2014-03-15: 01:00:00 via INTRAVENOUS

## 2014-03-15 MED ORDER — LANOLIN HYDROUS EX OINT: TOPICAL_OINTMENT | CUTANEOUS | Status: DC | PRN

## 2014-03-15 MED ORDER — DIBUCAINE 1 % RE OINT: 1.0000 "application " | TOPICAL_OINTMENT | RECTAL | Status: DC | PRN

## 2014-03-15 MED ORDER — BENZOCAINE-MENTHOL 20-0.5 % EX AERO
1.0000 "application " | INHALATION_SPRAY | CUTANEOUS | Status: DC | PRN
  Filled 2014-03-15 (×2): qty 56

## 2014-03-15 MED ORDER — DIPHENHYDRAMINE HCL 25 MG PO CAPS: 25.0000 mg | ORAL_CAPSULE | Freq: Four times a day (QID) | ORAL | Status: DC | PRN

## 2014-03-15 NOTE — Plan of Care (Signed)
Problem: Phase I Progression Outcomes Goal: Pain controlled with appropriate interventions Outcome: Completed/Met Date Met:  03/15/14 Goal: Voiding adequately Outcome: Completed/Met Date Met:  03/15/14 Goal: OOB as tolerated unless otherwise ordered Outcome: Completed/Met Date Met:  03/15/14 Goal: VS, stable, temp < 100.4 degrees F Outcome: Completed/Met Date Met:  03/15/14 Goal: Initial discharge plan identified Outcome: Completed/Met Date Met:  03/15/14 Goal: Other Phase I Outcomes/Goals Outcome: Completed/Met Date Met:  03/15/14

## 2014-03-15 NOTE — Plan of Care (Signed)
Problem: Phase II Progression Outcomes Goal: Pain controlled on oral analgesia Outcome: Completed/Met Date Met:  03/15/14 Goal: Progress activity as tolerated unless otherwise ordered Outcome: Completed/Met Date Met:  03/15/14 Goal: Afebrile, VS remain stable Outcome: Completed/Met Date Met:  03/15/14 Goal: Tolerating diet Outcome: Completed/Met Date Met:  03/15/14

## 2014-03-15 NOTE — Anesthesia Preprocedure Evaluation (Signed)
Anesthesia Evaluation  Patient identified by MRN, date of birth, ID band Patient awake    Reviewed: Allergy & Precautions, H&P , Patient's Chart, lab work & pertinent test results  Airway Mallampati: II  TM Distance: >3 FB Neck ROM: Full    Dental no notable dental hx. (+) Teeth Intact   Pulmonary former smoker,  breath sounds clear to auscultation  Pulmonary exam normal       Cardiovascular negative cardio ROS  Rhythm:Regular Rate:Normal     Neuro/Psych negative neurological ROS  negative psych ROS   GI/Hepatic Neg liver ROS, GERD-  Medicated and Controlled,  Endo/Other  diabetes  Renal/GU Renal disease  negative genitourinary   Musculoskeletal   Abdominal   Peds  Hematology negative hematology ROS (+)   Anesthesia Other Findings   Reproductive/Obstetrics (+) Pregnancy HSV                             Anesthesia Physical Anesthesia Plan  ASA: II  Anesthesia Plan: Epidural   Post-op Pain Management:    Induction:   Airway Management Planned: Natural Airway  Additional Equipment:   Intra-op Plan:   Post-operative Plan:   Informed Consent: I have reviewed the patients History and Physical, chart, labs and discussed the procedure including the risks, benefits and alternatives for the proposed anesthesia with the patient or authorized representative who has indicated his/her understanding and acceptance.     Plan Discussed with: Anesthesiologist  Anesthesia Plan Comments:         Anesthesia Quick Evaluation

## 2014-03-15 NOTE — Lactation Note (Signed)
This note was copied from the chart of Jenna Duane BostonKriston Cullom. Lactation Consultation Note  Patient Name: Jenna Leblanc RUEAV'WToday's Date: 03/15/2014 Reason for consult: Follow-up assessment Baby 12 hours of life. Assisted mom to attempt to latch in both football and cross-cradle position to right breast. Mom return-demonstrated hand expression with colostrum visible. Mom reports baby has been sleepy since birth and not interested in nursing. Baby fussy with latching attempts. LC placed gloved finger in baby's mouth to elicit suck. Took baby several minutes to begin gently sucking on LC's gloved finger. Demonstrated to parents how lips should flange outward. Baby interested in licking and suckling gently around nipple, but not wanted to latch assertively and suckle. Enc mom to keep baby STS and continue to place baby to breast to latch when baby shows feeding cues. Reiterated newborn behavior. Enc mom to call for assistance with nursing and latching as needed. Discussed cluster-feeding as well.   Maternal Data Has patient been taught Hand Expression?: Yes Does the patient have breastfeeding experience prior to this delivery?: No  Feeding Feeding Type: Breast Fed Length of feed: 0 min  LATCH Score/Interventions Latch: Too sleepy or reluctant, no latch achieved, no sucking elicited. Intervention(s): Skin to skin;Teach feeding cues;Waking techniques  Audible Swallowing: None  Type of Nipple: Everted at rest and after stimulation  Comfort (Breast/Nipple): Soft / non-tender     Hold (Positioning): Assistance needed to correctly position infant at breast and maintain latch.  LATCH Score: 5  Lactation Tools Discussed/Used     Consult Status Consult Status: Follow-up Date: 03/16/14 Follow-up type: In-patient    Geralynn OchsWILLIARD, Wes Lezotte 03/15/2014, 7:56 PM

## 2014-03-15 NOTE — Lactation Note (Signed)
This note was copied from the chart of Girl Duane BostonKriston Paulos. Lactation Consultation Note  Patient Name: Girl Duane BostonKriston Tober ZOXWR'UToday's Date: 03/15/2014 Reason for consult: Initial assessment Baby 10 hours of life. Mom reports baby has been sleepy at breasts. Discussed normal newborn behavior. Discussed offering lots of STS. Family holding baby currently, and baby dressed head-to-toe. Mom requests LC re-visit later to assist with positioning and latching. Discussed returning to work and nursing/pumping. [facility], aware of [facility], community resources, and [facility] after [facility].   Maternal Data    Feeding Feeding Type:  (Mom requests LC return later to assist with latching, has company and eating dinner at this time.)  [facility]                      Lactation Tools Discussed/Used     Consult Status Consult Status: PRN    Geralynn OchsWILLIARD, Nary Sneed 03/15/2014, 5:55 PM

## 2014-03-15 NOTE — Progress Notes (Signed)
Patient ID: Jenna Leblanc, female   DOB: December 14, 1980, 33 y.o.   MRN: [id number] Jenna Leblanc is a 33 y.o. G2P0010 at 2160w1d admitted for active labor, rupture of membranes  Subjective: Comfortable w/ epidural, no complaints, no urge to push  Objective: BP 123/65 mmHg  Pulse 72  Temp(Src) 98.5 F (36.9 C) (Oral)  Resp 18  Ht 5' 2.5" (1.588 m)  Wt 73.936 kg (163 lb)  BMI 29.32 kg/m2  SpO2 100%  LMP 05/22/2013    FHT:  FHR: 135 bpm, variability: moderate,  accelerations:  Present,  decelerations:  Absent UC:   Regular q 2-26mins  SVE:   Dilation: 10 Effacement (%): 100 Station: +1 Exam by:: Marijah Larranaga, CNM  Labs: Lab Results  Component Value Date   WBC 11.3* 03/14/2014   HGB 13.0 03/14/2014   HCT 38.4 03/14/2014   MCV 85.0 03/14/2014   PLT 145* 03/14/2014    Assessment / Plan: Spontaneous labor, progressing normally, will labor down for now  Labor: Progressing normally Fetal Wellbeing:  Category I Pain Control:  Epidural Pre-eclampsia: n/a I/D:  n/a Anticipated MOD:  NSVD  Marge DuncansBooker, Delayna Sparlin Randall CNM, WHNP-BC 03/15/2014, [id number]

## 2014-03-15 NOTE — Anesthesia Postprocedure Evaluation (Signed)
  Anesthesia Post-op Note  Patient: Raye SorrowKriston G Engelson  Procedure(s) Performed: * No procedures listed *  Patient Location: Mother/Baby  Anesthesia Type:Epidural  Level of Consciousness: awake and alert   Airway and Oxygen Therapy: Patient Spontanous Breathing  Post-op Pain: mild  Post-op Assessment: Post-op Vital signs reviewed, Patient's Cardiovascular Status Stable, Respiratory Function Stable, No signs of Nausea or vomiting, Pain level controlled, No headache, No residual numbness and No residual motor weakness  Post-op Vital Signs: Reviewed  Last Vitals:  Filed Vitals:   03/15/14 1430  BP: 104/52  Pulse: 77  Temp: 37.1 C  Resp: 16    Complications: No apparent anesthesia complications

## 2014-03-15 NOTE — Anesthesia Procedure Notes (Signed)
Epidural Patient location during procedure: OB Start time: 03/15/2014 1:25 AM  Staffing Anesthesiologist: Jessilyn Catino A. Performed by: anesthesiologist   Preanesthetic Checklist Completed: patient identified, site marked, surgical consent, pre-op evaluation, timeout performed, IV checked, risks and benefits discussed and monitors and equipment checked  Epidural Patient position: sitting Prep: site prepped and draped and DuraPrep Patient monitoring: continuous pulse ox and blood pressure Approach: midline Location: L3-L4 Injection technique: LOR air  Needle:  Needle type: Tuohy  Needle gauge: 17 G Needle length: 9 cm and 9 Needle insertion depth: 5 cm cm Catheter type: closed end flexible Catheter size: 19 Gauge Catheter at skin depth: 10 cm Test dose: negative and Other  Assessment Events: blood not aspirated, injection not painful, no injection resistance, negative IV test and no paresthesia  Additional Notes Patient identified. Risks and benefits discussed including failed block, incomplete  Pain control, post dural puncture headache, nerve damage, paralysis, blood pressure Changes, nausea, vomiting, reactions to medications-both toxic and allergic and post Partum back pain. All questions were answered. Patient expressed understanding and wished to proceed. Sterile technique was used throughout procedure. Epidural site was Dressed with sterile barrier dressing. No paresthesias, signs of intravascular injection Or signs of intrathecal spread were encountered.  Patient was more comfortable after the epidural was dosed. Please see RN's note for documentation of vital signs and FHR which are stable.

## 2014-03-16 LAB — RPR

## 2014-03-16 NOTE — Lactation Note (Signed)
This note was copied from the chart of Jenna Leblanc. Lactation Consultation Note  Patient Name: Jenna ColumbusCameron Cumbo EAVWU'JToday's Date: 03/16/2014 Reason for consult: Follow-up assessment;Breast/nipple pain;Infant < 6lbs.  Baby has been sleepy since birth but has had some successful feedings for up to 60 minutes, with LATCH scores of 7, then recently "6" due to mild/moderate nipple soreness. RN, Marcelino DusterMichelle has provided comfort gelpads for this mom to use on nipples between feedings for soreness.  Mom recently fed baby on both breasts but has not yet applied comfort gelpads.  Use reviewed and mom is already applying ebm to nipples before and after nursing.  LC to be called for latch assistance or Bf concens as needed. Cue feeding encouraged. Baby is to be fed at least every 3 hours due to weight but is not premature and both mom and nurse report strong sucking bursts now with feedings and some swallows.   Maternal Data    Feeding Feeding Type: Bottle Fed - Breast Milk Length of feed: 30 min  LATCH Score/Interventions Latch: Repeated attempts needed to sustain latch, nipple held in mouth throughout feeding, stimulation needed to elicit sucking reflex. Intervention(s): Skin to skin Intervention(s): Assist with latch  Audible Swallowing: None Intervention(s): Skin to skin Intervention(s): Hand expression  Type of Nipple: Everted at rest and after stimulation  Comfort (Breast/Nipple): Filling, red/small blisters or bruises, mild/mod discomfort  Problem noted: Mild/Moderate discomfort Interventions (Mild/moderate discomfort): Hand expression;Comfort gels  Hold (Positioning): No assistance needed to correctly position infant at breast.  LATCH Score: 6 (recent feeding assessment by RN, Marcelino DusterMichelle)  Lactation Tools Discussed/Used   Comfort gelpads Nipple care and signs of proper latch  Consult Status Consult Status: Follow-up Date: 03/17/14 Follow-up type: In-patient    Warrick ParisianBryant, Zamyra Allensworth  [facility] 03/16/2014, 5:44 PM

## 2014-03-16 NOTE — Plan of Care (Signed)
Problem: Discharge Progression Outcomes Goal: Activity appropriate for discharge plan Outcome: Completed/Met Date Met:  03/16/14

## 2014-03-16 NOTE — Plan of Care (Signed)
Problem: Discharge Progression Outcomes Goal: Tolerating diet Outcome: Completed/Met Date Met:  03/16/14 Goal: Pain controlled with appropriate interventions Outcome: Completed/Met Date Met:  03/16/14     

## 2014-03-16 NOTE — Plan of Care (Signed)
Problem: Phase II Progression Outcomes Goal: Other Phase II Outcomes/Goals Outcome: Completed/Met Date Met:  03/16/14  Problem: Discharge Progression Outcomes Goal: Afebrile, VS remain stable at discharge Outcome: Completed/Met Date Met:  03/16/14

## 2014-03-16 NOTE — Progress Notes (Signed)
Post Partum Day 1 Subjective: no complaints, up ad lib, voiding, tolerating PO and + flatus  Objective: Blood pressure 112/53, pulse 75, temperature 98.5 F (36.9 C), temperature source Oral, resp. rate 18, height 5' 2.5" (1.588 m), weight 163 lb (73.936 kg), last menstrual period 05/22/2013, SpO2 100 %, unknown if currently breastfeeding.  Physical Exam:  General: alert, cooperative, appears stated age and no distress Lochia: appropriate Uterine Fundus: firm Incision: n/a DVT Evaluation: No evidence of DVT seen on physical exam. Negative Homan's sign. No cords or calf tenderness. No significant calf/ankle edema.   Recent Labs  03/14/14 2242  HGB 13.0  HCT 38.4    Assessment/Plan: Plan for discharge tomorrow   LOS: 2 days   [name], [name] 03/16/2014, 4:47 AM

## 2014-03-17 MED ORDER — ETONOGESTREL-ETHINYL ESTRADIOL 0.12-0.015 MG/24HR VA RING
VAGINAL_RING | VAGINAL | Status: DC
Start: 2014-03-17 — End: 2014-03-23

## 2014-03-17 MED ORDER — IBUPROFEN 600 MG PO TABS: 600.0000 mg | ORAL_TABLET | Freq: Four times a day (QID) | ORAL | Status: DC

## 2014-03-17 NOTE — Lactation Note (Signed)
This note was copied from the chart of Jenna Leblanc. Lactation Consultation Note: Follow up visit with mom before DC. Mom reports that baby just finished feeding for 20 min. Asleep skin to skin with mom. Reports baby was up a lot through the night. Reports that nipples are tender but feeling a little better since she has been using the comfort gels. Reviewed engorgement prevention and treatment. Has Medela pump for home. Reviewed BFSG and OP appointments as resources for support after DC. No further questions at present. To call prn Patient Name: Jenna ColumbusCameron Kim ZOXWR'UToday's Date: 03/17/2014 Reason for consult: Follow-up assessment   Maternal Data Formula Feeding for Exclusion: No Has patient been taught Hand Expression?: Yes Does the patient have breastfeeding experience prior to this delivery?: No  Feeding Feeding Type:  (mom & infant asleep)  LATCH Score/Interventions                      Lactation Tools Discussed/Used     Consult Status Consult Status: Complete    Jenna Leblanc, Jenna Leblanc D 03/17/2014, 7:55 AM

## 2014-03-17 NOTE — Lactation Note (Signed)
This note was copied from the chart of Jenna Leblanc [place]. Lactation Consultation Note: asked by Ped Dr. Margo AyeHall to reviewed hand expression with mom. She assisted her- mom was pinching on the nipple. Photographer in taking pictures of baby. Reviewed with mom- encouraged to place deeper on breast. Able to see drop of Colostrum with first compression. No further questions at present. To call prn or if needs OP appointment.   Patient Name: Jenna Leblanc Marcantel ZOXWR'UToday's Date: 03/17/2014 Reason for consult: Follow-up assessment   Maternal Data   Feeding Feeding Type: Breast Fed  LATCH Score/Interventions  Lactation Tools Discussed/Used     Consult Status Consult Status: Complete    Pamelia HoitWeeks, Lavena Loretto D 03/17/2014, 11:39 AM

## 2014-03-17 NOTE — Plan of Care (Signed)
Problem: Consults Goal: Postpartum Patient Education (See Patient Education module for education specifics.)  Outcome: Progressing  Problem: Discharge Progression Outcomes Goal: Barriers To Progression Addressed/Resolved Outcome: Not Applicable Date Met:  36/85/99 Goal: Complications resolved/controlled Outcome: Not Applicable Date Met:  23/41/44 Goal: Discharge plan in place and appropriate Outcome: Completed/Met Date Met:  03/17/14

## 2014-03-17 NOTE — Discharge Instructions (Signed)

## 2014-03-17 NOTE — Discharge Summary (Signed)
Obstetric Discharge Summary Reason for Admission: onset of labor Prenatal Procedures: none Intrapartum Procedures: spontaneous vaginal delivery Postpartum Procedures: none Complications-Operative and Postpartum: first degree perineal laceration  Hospital Course:  Patient was admitted in active labor. She progressed normally with no complications.   Delivery Note At 7:23 AM a viable female was delivered via Vaginal, Spontaneous Delivery (Presentation: ; Occiput Anterior). APGAR: 8, 9; weight pending .  Placenta status: Intact, Spontaneous. Cord: 3 vessels with the following complications: None. Cord pH: na  Anesthesia: Epidural  Episiotomy: None Lacerations: 1st degree Suture Repair: 3.0 vicryl Est. Blood Loss (mL): 400  Mom to postpartum. Baby to Couplet care / Skin to Skin.  Koch, Kari L 03/15/2014, 7:41 AM   Postpartum, the patient did well with no complications.  On the day of discharge,  Pt denied problems with ambulating, voiding or po intake.  She denied nausea or vomiting.  Pain was well controlled.  She has had flatus. She has had bowel movement.  Lochia Small.  Plan for birth control is NuvaRing vaginal inserts.    H/H: Lab Results  Component Value Date/Time   HGB 13.0 03/14/2014 10:42 PM   HCT 38.4 03/14/2014 10:42 PM    Filed Vitals:   03/17/14 0545  BP: 91/53  Pulse: 65  Temp: 98.4 F (36.9 C)  Resp: 18    Physical Exam: VSS NAD Abd: Appropriately tender, ND, Fundus firm, below umbilicus No c/c/e, Neg homan's sign, neg cords Lochia Appropriate  Discharge Diagnoses: Term Pregnancy-delivered  Discharge Information: Date: 03/17/2014 Activity: pelvic rest Diet: routine  Medications: PNV and Ibuprofen Breast feeding:  Yes Condition: stable Instructions: refer to handout Discharge to: home      Medication List    STOP taking these medications        ACCU-CHEK FASTCLIX LANCETS Misc     ACCU-CHEK NANO SMARTVIEW W/DEVICE Kit     glucose blood test strip  Commonly known as:  ACCU-CHEK SMARTVIEW     valACYclovir 500 MG tablet  Commonly known as:  VALTREX      TAKE these medications        calcium carbonate 500 MG chewable tablet  Commonly known as:  TUMS - dosed in mg elemental calcium  Chew 2 tablets by mouth 2 (two) times daily as needed for indigestion or heartburn.     dextromethorphan-guaiFENesin 30-600 MG per 12 hr tablet  Commonly known as:  MUCINEX DM  Take 1 tablet by mouth 2 (two) times daily as needed for cough (and congestion).     etonogestrel-ethinyl estradiol 0.12-0.015 MG/24HR vaginal ring  Commonly known as:  NUVARING  Insert vaginally and leave in place for 3 consecutive weeks, then remove for 1 week.     hydrocortisone 2.5 % rectal cream  Commonly known as:  ANUSOL-HC  Place 1 application rectally 2 (two) times daily.     ibuprofen 600 MG tablet  Commonly known as:  ADVIL,MOTRIN  Take 1 tablet (600 mg total) by mouth every 6 (six) hours.     prenatal vitamin w/FE, FA 27-1 MG Tabs tablet  Take 1 tablet by mouth daily.       Follow-up Information    Follow up with [facility] [place]. Schedule an appointment as soon as possible for a visit in 6 weeks.   Specialty:  Obstetrics and Gynecology   Why:  for postpartum follow up   Contact information:   [address] [place] [address] [phone]        Parker, Caleb 03/17/2014,8:07 AM      

## 2014-03-19 ENCOUNTER — Encounter: Payer: 59 | Admitting: Obstetrics & Gynecology

## 2014-03-21 ENCOUNTER — Inpatient Hospital Stay (HOSPITAL_COMMUNITY): Admission: RE | Admit: 2014-03-21 | Payer: 59 | Source: Ambulatory Visit

## 2014-03-23 ENCOUNTER — Encounter: Payer: Self-pay | Admitting: Internal Medicine

## 2014-03-23 ENCOUNTER — Ambulatory Visit (INDEPENDENT_AMBULATORY_CARE_PROVIDER_SITE_OTHER): Payer: 59 | Admitting: Internal Medicine

## 2014-03-23 VITALS — BP 100/60 | HR 84 | Temp 98.0°F | Wt 148.0 lb

## 2014-03-23 DIAGNOSIS — R300 Dysuria: Secondary | ICD-10-CM

## 2014-03-23 DIAGNOSIS — J209 Acute bronchitis, unspecified: Secondary | ICD-10-CM | POA: Insufficient documentation

## 2014-03-23 LAB — POCT URINALYSIS DIPSTICK
Bilirubin, UA: NEGATIVE
Glucose, UA: NEGATIVE
KETONES UA: NEGATIVE
Nitrite, UA: NEGATIVE
Spec Grav, UA: >=1.03
Urobilinogen, UA: NEGATIVE
pH, UA: 5.5

## 2014-03-23 MED ORDER — AMOXICILLIN 500 MG PO TABS: 1000.0000 mg | ORAL_TABLET | Freq: Two times a day (BID) | ORAL | Status: DC

## 2014-03-23 MED ORDER — CEFUROXIME AXETIL 250 MG PO TABS: 250.0000 mg | ORAL_TABLET | Freq: Two times a day (BID) | ORAL | Status: DC

## 2014-03-23 NOTE — Assessment & Plan Note (Addendum)
Sick for 2 weeks Has symptoms of cystitis too Nursing a newborn---will treat both infections with amoxil (not really allergic)

## 2014-03-23 NOTE — Progress Notes (Signed)
   Subjective:    Patient ID: Jenna Leblanc, female    DOB: [birth year], 33 y.o.   MRN: [id number]  HPI Started with cough about a week before delivery of her baby  Going on for 2 weeks Green and white mucus No fever Feels a little SOB in past 3-4 days (I checked her chest 11/13 when in with baby--she was clear)  No headache or head congestion No sore throat or ear pain  No having perineal pain Relates to the cough Burning dysuria for 2-3 days Still with blood due to recent delivery  Current Outpatient Prescriptions on File Prior to Visit  Medication Sig Dispense Refill  . ibuprofen (ADVIL,MOTRIN) 600 MG tablet Take 1 tablet (600 mg total) by mouth every 6 (six) hours. 60 tablet 0  . prenatal vitamin w/FE, FA (PRENATAL 1 + 1) 27-1 MG TABS tablet Take 1 tablet by mouth daily.      No current facility-administered medications on file prior to visit.    Allergies  Allergen Reactions  . Penicillins Rash    Past Medical History  Diagnosis Date  . Gestational diabetes mellitus, antepartum   . Gestational diabetes     diet controlled  . Kidney stones     none in 8 years    Past Surgical History  Procedure Laterality Date  . Lithotripsy      Family History  Problem Relation Age of Onset  . Cancer Sister     sarcoma in the uterus  . Cancer Maternal Grandmother     lung  . Diabetes Maternal Grandmother   . Heart attack Maternal Grandfather   . Pneumonia Paternal Grandmother   . Diabetes Paternal Grandmother   . Cancer Paternal Grandfather     ?spine    History   Social History  . Marital Status: Married    Spouse Name: N/A    Number of Children: N/A  . Years of Education: N/A   Occupational History  . Not on file.   Social History Main Topics  . Smoking status: Former Smoker    Quit date: 07/15/2006  . Smokeless tobacco: Never Used  . Alcohol Use: Yes     Comment: 2-3 times per week  . Drug Use: No  . Sexual Activity: Yes    Birth Control/  Protection: None   Other Topics Concern  . Not on file   Social History Narrative   Review of Systems No vomiting or diarrhea No rash Some bad taste in mouth from mucus Appetite is okay    Objective:   Physical Exam  Constitutional: She appears well-developed and well-nourished. No distress.  HENT:  Nose: Nose normal.  Mouth/Throat: Oropharynx is clear and moist. No oropharyngeal exudate.  No sinus tenderness TMs normal Mild nasal congestion  Neck: Normal range of motion. Neck supple. No thyromegaly present.  Pulmonary/Chest: Effort normal and breath sounds normal. No respiratory distress. She has no wheezes. She has no rales.  Lymphadenopathy:    She has no cervical adenopathy.          Assessment & Plan:

## 2014-03-23 NOTE — Progress Notes (Signed)
Pre visit review using our clinic review tool, if applicable. No additional management support is needed unless otherwise documented below in the visit note. 

## 2014-04-23 ENCOUNTER — Encounter: Payer: Self-pay | Admitting: Obstetrics & Gynecology

## 2014-04-23 ENCOUNTER — Ambulatory Visit (INDEPENDENT_AMBULATORY_CARE_PROVIDER_SITE_OTHER): Payer: 59 | Admitting: Obstetrics & Gynecology

## 2014-04-23 MED ORDER — NORETHINDRONE 0.35 MG PO TABS: 1.0000 | ORAL_TABLET | Freq: Every day | ORAL | Status: DC

## 2014-04-23 NOTE — Progress Notes (Signed)
   Subjective:    Patient ID: Raye SorrowKriston G Leather, female    DOB: May 22, 1980, 33 y.o.   MRN: [id number]  HPI  See smart set note   Review of Systems     Objective:   Physical Exam        Assessment & Plan:   Subjective:     Raye SorrowKriston G Uemura is a 33 y.o. female MW who presents for a postpartum visit. She is 5 weeks postpartum following a spontaneous vaginal delivery. I have fully reviewed the prenatal and intrapartum course. The delivery was at term gestational weeks. Outcome: spontaneous vaginal delivery. Anesthesia: epidural. Postpartum course has been uncomplicated. Baby's course has been uncomplicated. Baby is feeding by breast. Bleeding no bleeding. Bowel function is normal. Bladder function is normal. Patient is not sexually active. Contraception method is oral progesterone-only contraceptive. Postpartum depression screening: negative.  The following portions of the patient's history were reviewed and updated as appropriate: allergies, current medications, past family history, past medical history, past social history, past surgical history and problem list.  Review of Systems A comprehensive review of systems was negative.   Objective:    BP 107/65 mmHg  Pulse 71  Ht 5' 2.5" (1.588 m)  Wt 140 lb 3.2 oz (63.594 kg)  BMI 25.22 kg/m2  Breastfeeding? Yes  General:  alert   Breasts:  inspection negative, no nipple discharge or bleeding, no masses or nodularity palpable  Lungs: clear to auscultation bilaterally  Heart:  regular rate and rhythm, S1, S2 normal, no murmur, click, rub or gallop  Abdomen: soft, non-tender; bowel sounds normal; no masses,  no organomegaly   Vulva:  normal  Vagina: normal vagina  Cervix:  anteverted  Corpus: not examined  Adnexa:  normal adnexa  Rectal Exam: Not performed.        Assessment:     Normal postpartum exam. Pap smear not done at today's visit.   Plan:    1. Contraception: oral progesterone-only contraceptive 2. GDM- she  reports normal sugars at home but will have 2 hour GTT soon 3. Follow up in: 1 week or as needed.

## 2014-04-27 ENCOUNTER — Other Ambulatory Visit (INDEPENDENT_AMBULATORY_CARE_PROVIDER_SITE_OTHER): Payer: 59 | Admitting: *Deleted

## 2014-04-27 DIAGNOSIS — O24410 Gestational diabetes mellitus in pregnancy, diet controlled: Secondary | ICD-10-CM

## 2014-04-27 DIAGNOSIS — O24430 Gestational diabetes mellitus in the puerperium, diet controlled: Secondary | ICD-10-CM

## 2014-04-27 NOTE — Progress Notes (Signed)
Pt here today for a 2 hr GTT.  Pt had gestational diabetes during pregnancy.

## 2014-04-28 LAB — GLUCOSE TOLERANCE, 2 HOURS
GLUCOSE, 2 HOUR: 69 mg/dL — AB (ref 70–139)
GLUCOSE, FASTING: 86 mg/dL (ref 70–99)

## 2014-12-23 ENCOUNTER — Encounter (INDEPENDENT_AMBULATORY_CARE_PROVIDER_SITE_OTHER): Payer: Self-pay

## 2014-12-23 ENCOUNTER — Ambulatory Visit (INDEPENDENT_AMBULATORY_CARE_PROVIDER_SITE_OTHER): Payer: No Typology Code available for payment source | Admitting: Internal Medicine

## 2014-12-23 ENCOUNTER — Encounter: Payer: Self-pay | Admitting: Internal Medicine

## 2014-12-23 VITALS — BP 110/70 | HR 71 | Temp 98.2°F | Wt 145.0 lb

## 2014-12-23 DIAGNOSIS — N63 Unspecified lump in unspecified breast: Secondary | ICD-10-CM

## 2014-12-23 DIAGNOSIS — J0100 Acute maxillary sinusitis, unspecified: Secondary | ICD-10-CM

## 2014-12-23 MED ORDER — AMOXICILLIN 500 MG PO TABS: 1000.0000 mg | ORAL_TABLET | Freq: Two times a day (BID) | ORAL | Status: DC

## 2014-12-23 NOTE — Assessment & Plan Note (Signed)
~  2cm firm non tender mass just below right nipple Increased glandular component on that side as well  Discussed that this is almost certainly benign with recent cessation of breast feeding If persists for 2 months, will set up with surgeon

## 2014-12-23 NOTE — Patient Instructions (Signed)
Please try nasacort or flonase regularly to help the allergies and nasal congestion. Start the antibiotic if you are worsening Call for referral if the breast lump persists over 2 or more months

## 2014-12-23 NOTE — Progress Notes (Signed)
   Subjective:    Patient ID: Jenna Leblanc, female    DOB: [birth year], 34 y.o.   MRN: [id number]  HPI Here due to respiratory symptoms  Has started with sneezing and nasal congestion Now with increased rhinorrhea Coughing some Tried allergy meds  Started yesterday bad but some early symptoms 2-3 days Some dry nose and bleeding for over 10 days Recent visit to [place]  No fever No SOB until today--- just had to cough with deep breath  Has used some natural OTC products and silver nose spray  Knot in right breast she wants checked Pretty much stopped nursing recently Can express small amount of milk if she tries Not overly painful  Current Outpatient Prescriptions on File Prior to Visit  Medication Sig Dispense Refill  . etonogestrel-ethinyl estradiol (NUVARING) 0.12-0.015 MG/24HR vaginal ring Place 1 each vaginally every 28 (twenty-eight) days. Insert vaginally and leave in place for 3 consecutive weeks, then remove for 1 week.     No current facility-administered medications on file prior to visit.    No Known Allergies  Past Medical History  Diagnosis Date  . Gestational diabetes mellitus, antepartum   . Gestational diabetes     diet controlled  . Kidney stones     none in 8 years    Past Surgical History  Procedure Laterality Date  . Lithotripsy      Family History  Problem Relation Age of Onset  . Cancer Sister     sarcoma in the uterus  . Cancer Maternal Grandmother     lung  . Diabetes Maternal Grandmother   . Heart attack Maternal Grandfather   . Pneumonia Paternal Grandmother   . Diabetes Paternal Grandmother   . Cancer Paternal Grandfather     ?spine    Social History   Social History  . Marital Status: Married    Spouse Name: N/A  . Number of Children: 1  . Years of Education: N/A   Occupational History  . Real Therapist, occupational    Social History Main Topics  . Smoking status: Former Smoker    Quit date: 07/15/2006  .  Smokeless tobacco: Never Used  . Alcohol Use: 0.0 oz/week    0 Standard drinks or equivalent per week     Comment: 2-3 times per week  . Drug Use: No  . Sexual Activity:    Partners: Male    Birth Control/ Protection: None   Other Topics Concern  . Not on file   Social History Narrative   Review of Systems Some vertigo No rash No vomiting or diarrhea Appetite is fine    Objective:   Physical Exam  Constitutional: She appears well-developed and well-nourished. No distress.  HENT:  Mouth/Throat: Oropharynx is clear and moist. No oropharyngeal exudate.  Mild maxillary tenderness Moderate nasal inflammation TMs normal  Neck: Normal range of motion. Neck supple. No thyromegaly present.  Pulmonary/Chest: Effort normal and breath sounds normal. No respiratory distress. She has no wheezes. She has no rales.  Lymphadenopathy:    She has no cervical adenopathy.          Assessment & Plan:

## 2014-12-23 NOTE — Assessment & Plan Note (Signed)
Seems to be viral and has allergy component Will have her try steroid spray If worsens, fill the amoxicillin

## 2014-12-23 NOTE — Progress Notes (Signed)
Pre visit review using our clinic review tool, if applicable. No additional management support is needed unless otherwise documented below in the visit note. 

## 2014-12-25 ENCOUNTER — Encounter: Payer: Self-pay | Admitting: Internal Medicine

## 2015-04-23 IMAGING — US US OB COMP +14 WK
1 series · 12 of 28 positions shown · non-contrast
Comparison: none

[Series 1: us ob detail +14 wk · 12 of 105 slices shown]
[im 4/105]
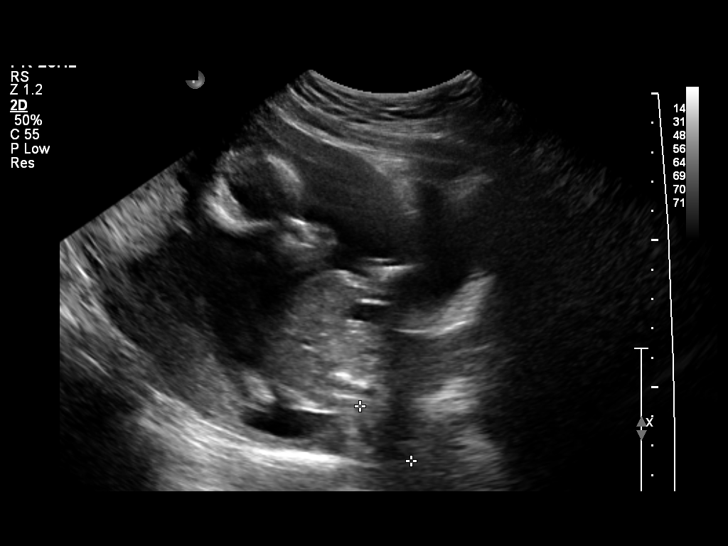
[im 12/105]
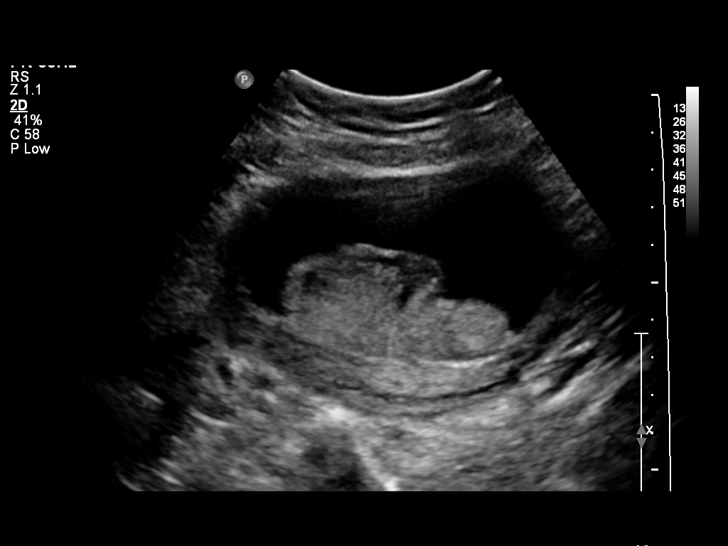
[im 20/105]
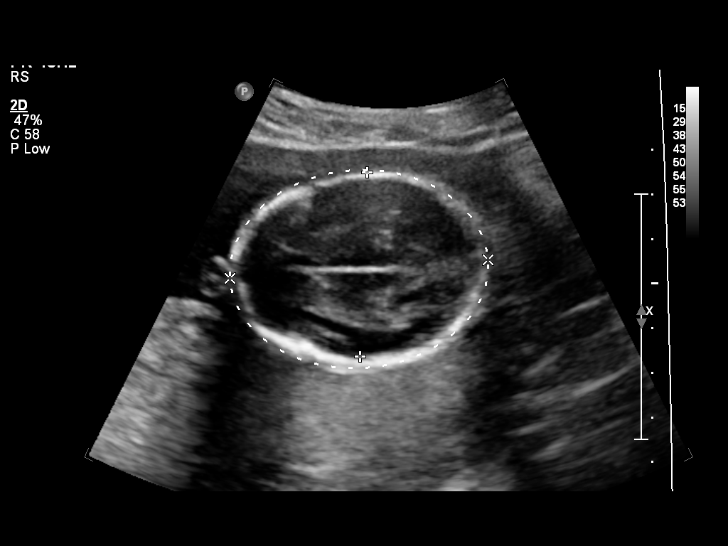
[im 31/105]
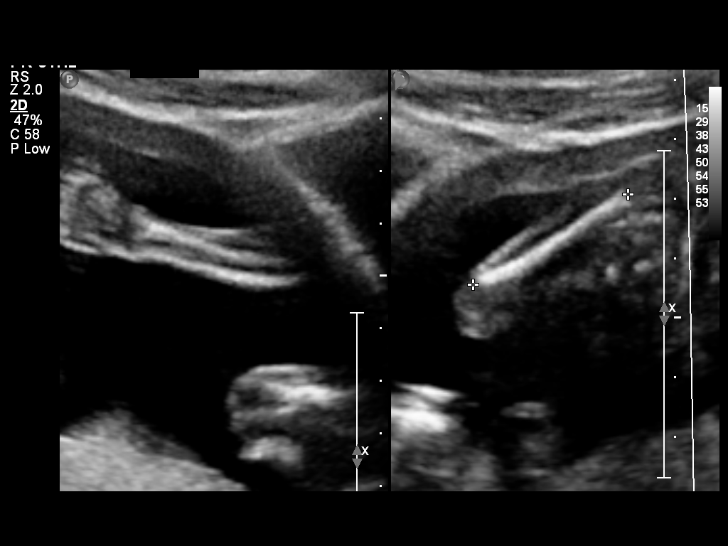
[im 39/105]
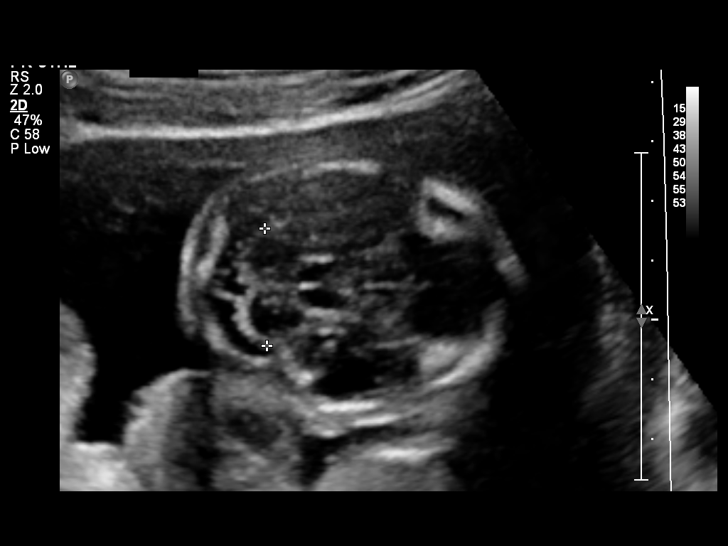
[im 47/105]
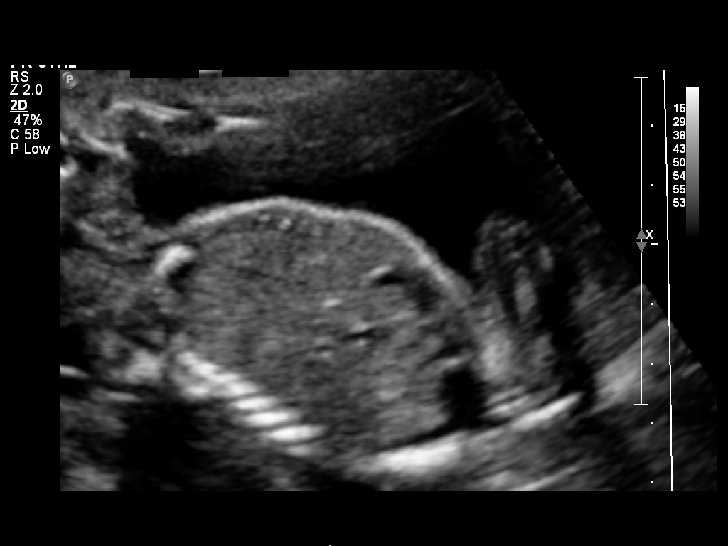
[im 58/105]
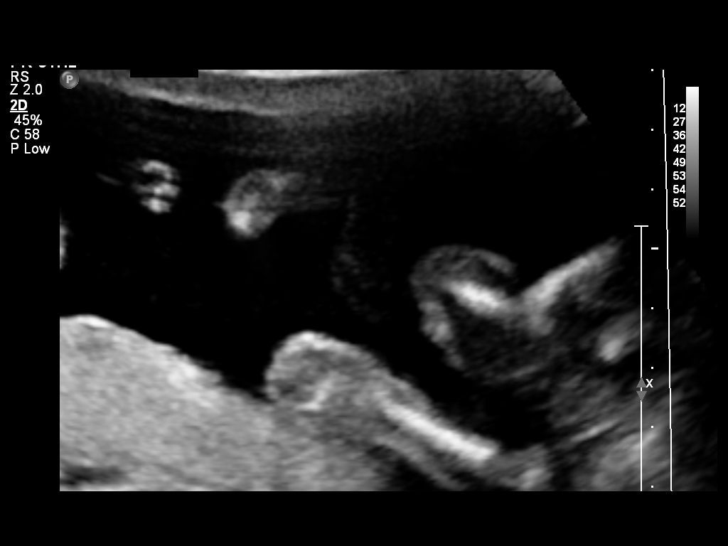
[im 66/105]
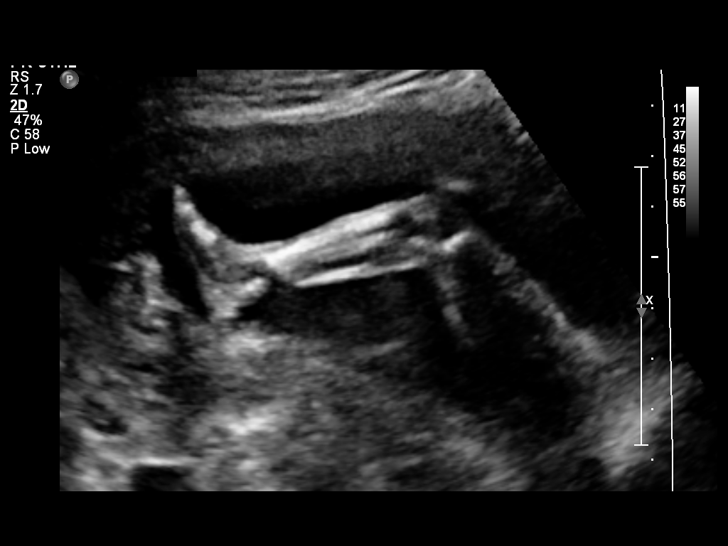
[im 74/105]
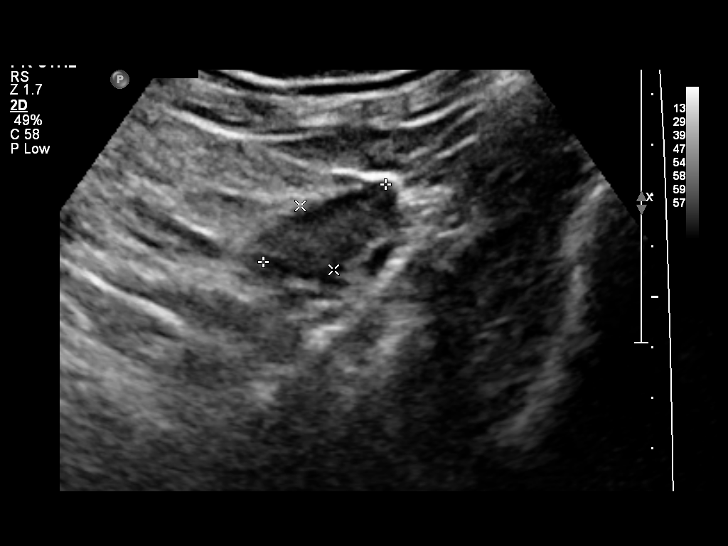
[im 85/105]
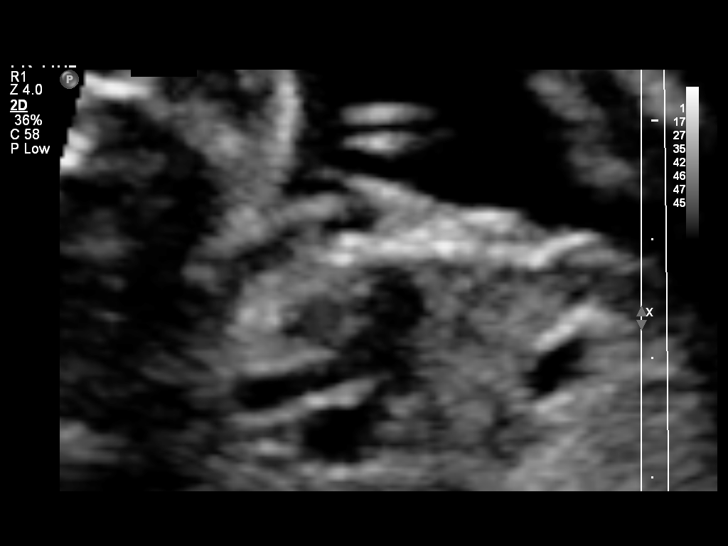
[im 93/105]
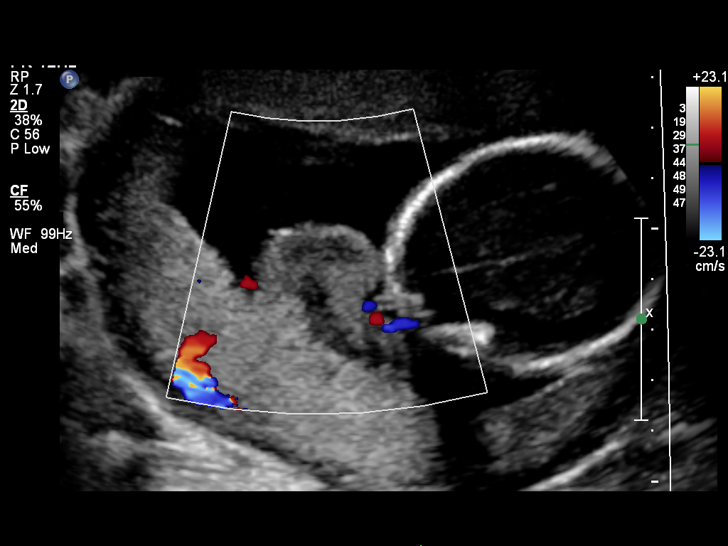
[im 101/105]
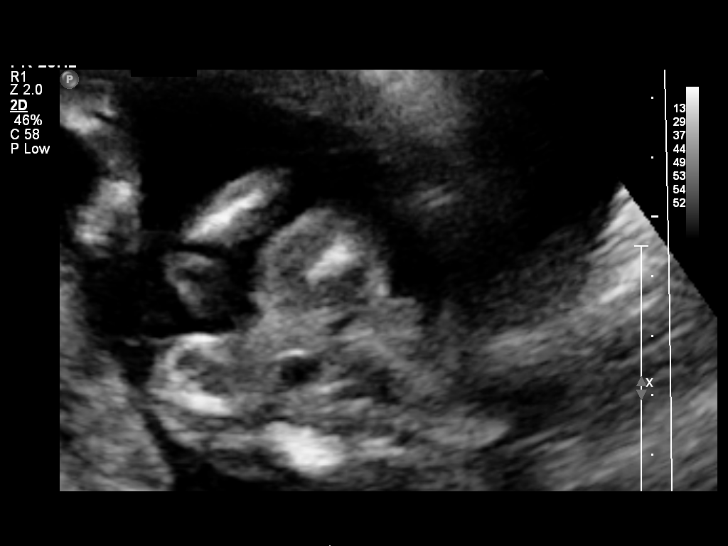

[12 of 28 positions shown; findings below may reference images not displayed]

OBSTETRICS REPORT
                      (Signed Final 10/31/2013 [DATE])

Service(s) Provided

 US OB COMP + 14 WK                                    76805.1
Indications

 Basic anatomic survey
Fetal Evaluation

 Num Of Fetuses:    1
 Fetal Heart Rate:  156                          bpm
 Cardiac Activity:  Observed
 Presentation:      Breech
 Placenta:          Posterior, above cervical
                    os

 Amniotic Fluid
 AFI FV:      Subjectively within normal limits
                                             Larg Pckt:     4.7  cm
Biometry

 BPD:     42.3  mm     G. Age:  18w 6d                CI:        66.62   70 - 86
                                                      FL/HC:      18.3   16.8 -

 HC:     166.1  mm     G. Age:  19w 2d       20  %    HC/AC:      1.19   1.09 -

 AC:     139.9  mm     G. Age:  19w 3d       29  %    FL/BPD:
 FL:      30.4  mm     G. Age:  19w 3d       26  %    FL/AC:      21.7   20 - 24
 HUM:       30  mm     G. Age:  19w 6d       53  %
 CER:     18.2  mm     G. Age:  18w 1d      < 5  %
 NFT:     3.59  mm

 Est. FW:     288  gm    0 lb 10 oz      39  %
Gestational Age

 LMP:           23w 1d        Date:  05/22/13                 EDD:   02/26/14
 U/S Today:     19w 2d                                        EDD:   03/25/14
 Best:          19w 6d     Det. By:  Early Ultrasound         EDD:   03/21/14
                                     (08/01/13)
Anatomy

 Cranium:          Appears normal         Aortic Arch:      Not well visualized
 Fetal Cavum:      Appears normal         Ductal Arch:      Not well visualized
 Ventricles:       Appears normal         Diaphragm:        Appears normal
 Choroid Plexus:   Appears normal         Stomach:          Appears normal, left
                                                            sided
 Cerebellum:       Appears normal         Abdomen:          Appears normal
 Posterior Fossa:  Appears normal         Abdominal Wall:   Appears nml (cord
                                                            insert, abd wall)
 Nuchal Fold:      Appears normal         Cord Vessels:     Appears normal (3
                                                            vessel cord)
 Face:             Orbits appear          Kidneys:          Appear normal
                   normal
 Lips:             Appears normal         Bladder:          Appears normal
 Heart:            Appears normal         Spine:            Not well visualized
                   (4CH, axis, and
                   situs)
 RVOT:             Appears normal         Lower             Appears normal
                                          Extremities:
 LVOT:             Appears normal         Upper             Appears normal
                                          Extremities:

 Other:  Technically difficult due to fetal position. Nasal bone visualized.
         Female gender.
Targeted Anatomy

 Fetal Central Nervous System
 Cisterna Magna:
Cervix Uterus Adnexa

 Cervical Length:    3        cm

 Cervix:       Normal appearance by transabdominal scan.
 Left Ovary:    Within normal limits.
 Right Ovary:   Within normal limits.
Impression

 SIUP at 90w7d on attempted comprehensive fetal survey
 EFW 39th%
 No dysmorphic features
 Limitations as documented above
 Placenta is implanted along the posterior uterine wall
 Incidental note is made of a heterogenous collection of
 swirling fluid between the placenta and the chorion, likely
 representative of subchorionic hemorrhage (or alternatively a
 superficial placental lake); this area measures 29 mm x 17mm
 There is no previa
 Cervix appears long and closed
Recommendations

 Recommend follow up attempt to complete fetal survey, plot
 interval growth, and reassess placenta in 4 weeks (please
 call to schedule if desired in our unit).

## 2015-04-26 ENCOUNTER — Telehealth: Payer: Self-pay | Admitting: Internal Medicine

## 2015-04-26 NOTE — Telephone Encounter (Signed)
Pt has appt 04/27/15 at 9:30 with Mayra ReelKate Clark NP.

## 2015-04-26 NOTE — Telephone Encounter (Signed)
[facility]  Patient Name: Jenna Leblanc  Gender: Female  DOB: [birth year]   Age: [age] Y 10 M 28 D  Return Phone Number: [phone]) [phone] (Primary)  Address:   City/State/Zip: [place]    Client [facility]  Client Site  [facility]  Physician Tillman AbideLetvak, Richard   Contact Type Call  Call Type Triage / Clinical  Relationship To Patient Self  Appointment Disposition EMR Appointment Scheduled  Info pasted into Epic Yes  Return Phone Number [phone]) [phone] (Primary)  Chief Complaint Dizziness  Initial Comment [name] states she has dizziness, nausea, diarrhea, had IBS and cold  PreDisposition Call Doctor   Nurse Assessment  Nurse: Elwyn LadeBurress, RN, Misty StanleyLisa Date/Time (Eastern Time): 04/26/2015 2:22:43 PM  Confirm and document reason for call. If symptomatic, describe symptoms. ---[name] states she has diarrhea with abd pain about 4-6x in the past 24 hrs and diarrhea for past 2 mths; varies by the day, has had IBS flares in the last 3 wks and had cold with sinus pressure and wants to make sure ears are ok, but no pain; dizziness with lightheadness over the past week; last urinated 10 min ago and drinking well  Has the patient traveled out of the country within the last 30 days? ---Not Applicable  Does the patient have any new or worsening symptoms? ---Yes  Will a triage be completed? ---Yes  Related visit to physician within the last 2 weeks? ---No  Does the PT have any chronic conditions? (i.e. diabetes, asthma, etc.) ---Yes  List chronic conditions. ---IBS  Did the patient indicate they were pregnant? ---No  Is this a behavioral health or substance abuse call? ---No     Guidelines      Guideline Title Affirmed Question Affirmed Notes Nurse Date/Time (Eastern Time)  Dizziness - Lightheadedness [1] MODERATE dizziness (e.g., interferes with normal activities) AND [2] has NOT been evaluated by physician for this (Exception:  dizziness caused by heat exposure, sudden standing, or poor fluid intake)  Burress, RN, Misty StanleyLisa 04/26/2015 2:28:59 PM  Diarrhea [1] MODERATE diarrhea (e.g., 4-6 times / day more than normal) AND [2] present > 48 hours (2 days)  Burress, RN, Misty StanleyLisa 04/26/2015 2:34:49 PM  Sinus Pain or Congestion [1] Sinus congestion as part of a cold AND [2] present < 10 days (all triage questions negative)  Burress, RN, Misty StanleyLisa 04/26/2015 2:37:18 PM   Disp. Time Lamount Cohen(Eastern Time) Disposition Final User   04/26/2015 2:34:20 PM See Physician within 24 Hours  Burress, RN, Misty StanleyLisa   04/26/2015 2:36:51 PM See Physician within 24 Hours  Burress, RN, Misty StanleyLisa    04/26/2015 2:42:16 PM Home Care Yes Burress, RN, Gracy RacerLisa        [name] Understands: Yes  Disagree/Comply: Phil Doppomply        [name] Understands: Yes  Disagree/Comply: Environmental consultantComply        [name] Understands: Yes  Disagree/Comply: Comply     Care Advice Given Per Guideline    * IF OFFICE WILL BE OPEN: You need to be seen within the next 24 hours. Call your doctor when the office opens, and make an appointment. FLUIDS: Drink several glasses of fruit juice, other clear fluids or water. This will improve hydration and blood glucose. If the weather is hot, make sure the fluids are cold. REST: Lie down with feet elevated for 1 hour. This will improve circulation and increase blood flow to the brain. CALL BACK IF: * Passes out (faints) *  You become worse. CARE ADVICE given per Dizziness (Adult) guideline.  * Signs of dehydration occur (e.g., no urine over 12 hours, very dry mouth, lightheaded, etc.) * Bloody stools * Constant or severe abdominal pain * You become worse. CARE ADVICE given per Diarrhea (Adult) guideline.  * Sinus congestion is a normal part of a cold. * Usually home treatment with nasal washes can prevent an actual bacterial sinus infection. FOR A STUFFY NOSE - USE NASAL WASHES: * Introduction: Saline (salt water) nasal irrigation (nasal wash) is an effective and simple home  remedy for treating stuffy nose and sinus congestion. The nose can be irrigated by pouring, spraying, or squirting salt water into the nose and then letting it run back out. * How it Helps: The salt water rinses out excess mucus, washes out any irritants (dust, allergens) that might be present, and moistens the nasal cavity. * Methods: There are several ways to perform nasal irrigation. You can use a saline nasal spray bottle (available over-the-counter), a rubber ear syringe, a medical syringe without the needle, or a NETI POT. * STEP 1: Lean over a sink. * STEP 2: Gently squirt or spray warm salt water into one of your nostrils. * STEP 3: Some of the water may run into the back of your throat. Spit this out. If you swallow the salt water it will not hurt you. * STEP 4: Blow your nose to clean out the water and mucus. * STEP 5: Repeat steps 1-4 for the other nostril. You can do this a couple times a day if it seems to help you. * If you have a very stuffy nose, nasal decongestant medicines can shrink the swollen nasal mucosa and allow for easier breathing. If you have a very runny nose, these medicines can reduce the amount of drainage. They may be taken as pills by mouth or as a nasal spray. * Most people do NOT need to use these medicines. If your nose feels blocked, you should try using nasal washes first. * PSEUDOEPHEDRINE (Sudafed) is available OTC in pill form. Typical adult dosage is two 30 mg tablets every 6 hours. * PHENYLEPHRINE (Sudafed PE) is available OTC in pill form. Typical adult dosage is one 10 mg tablet every 4 hours. * OXYMETAZOLINE NASAL DROPS (Afrin) are available OTC. Clean out the nose before using. Spray each nostril once, wait one minute for absorption, and then spray a second time. * PHENYLEPHRINE NASAL DROPS (Neo-Synephrine) are available OTC. Clean out the nose before using. Spray each nostril once, wait one minute for absorption, and then spray a second time. * Read the package  instructions on all medicines that you take. * For pain relief, take acetaminophen, ibuprofen, or naproxen. CALL BACK IF: * Severe pain persists over 2 hours after pain medicine * Sinus pain persists over 1 day after using nasal washes * Sinus congestion (fullness) persists over 10 days * Fever lasts over 3 days * You become worse. CARE ADVICE given per Sinus Pain or Congestion (Adult) guideline.     After Care Instructions Given     Call Event Type User Date / Time Description        Comments  User: Darrol Poke, RN Date/Time Lamount Cohen Time): 04/26/2015 2:53:18 PM  APPT SCHEDULED FOR 9:30 AM TOMORROW WITH AMY BEDSOLE   Referrals  REFERRED TO PCP OFFICE

## 2015-04-27 ENCOUNTER — Encounter: Payer: Self-pay | Admitting: Primary Care

## 2015-04-27 ENCOUNTER — Ambulatory Visit (INDEPENDENT_AMBULATORY_CARE_PROVIDER_SITE_OTHER): Payer: No Typology Code available for payment source | Admitting: Primary Care

## 2015-04-27 ENCOUNTER — Other Ambulatory Visit: Payer: Self-pay | Admitting: Primary Care

## 2015-04-27 VITALS — BP 112/76 | HR 72 | Temp 98.8°F | Wt 149.0 lb

## 2015-04-27 DIAGNOSIS — K589 Irritable bowel syndrome without diarrhea: Secondary | ICD-10-CM | POA: Diagnosis not present

## 2015-04-27 MED ORDER — PB-HYOSCY-ATROPINE-SCOPOLAMINE 16.2 MG PO TABS
1.0000 | ORAL_TABLET | Freq: Three times a day (TID) | ORAL | Status: DC | PRN
Start: 2015-04-27 — End: 2015-04-28

## 2015-04-27 MED ORDER — PB-HYOSCY-ATROPINE-SCOPOLAMINE 16.2 MG/5ML PO ELIX: 5.0000 mL | ORAL_SOLUTION | Freq: Three times a day (TID) | ORAL | Status: DC | PRN

## 2015-04-27 NOTE — Patient Instructions (Signed)
Take 5 ml by mouth three times daily (before meals) as needed for IBS symptoms. Please call me in 1 week with an update in your symptoms.  Continue Flonase for sinus pressure. Please call me if you develop fevers, notice green mucous from your nose, or experience increase in pressure.  It was a pleasure meeting you!  Diet for Irritable Bowel Syndrome When you have irritable bowel syndrome (IBS), the foods you eat and your eating habits are very important. IBS may cause various symptoms, such as abdominal pain, constipation, or diarrhea. Choosing the right foods can help ease discomfort caused by these symptoms. Work with your health care provider and dietitian to find the best eating plan to help control your symptoms. WHAT GENERAL GUIDELINES DO I NEED TO FOLLOW?  Keep a food diary. This will help you identify foods that cause symptoms. Write down:  What you eat and when.  What symptoms you have.  When symptoms occur in relation to your meals.  Avoid foods that cause symptoms. Talk with your dietitian about other ways to get the same nutrients that are in these foods.  Eat more foods that contain fiber. Take a fiber supplement if directed by your dietitian.  Eat your meals slowly, in a relaxed setting.  Aim to eat 5-6 small meals per day. Do not skip meals.  Drink enough fluids to keep your urine clear or pale yellow.  Ask your health care provider if you should take an over-the-counter probiotic during flare-ups to help restore healthy gut bacteria.  If you have cramping or diarrhea, try making your meals low in fat and high in carbohydrates. Examples of carbohydrates are pasta, rice, whole grain breads and cereals, fruits, and vegetables.  If dairy products cause your symptoms to flare up, try eating less of them. You might be able to handle yogurt better than other dairy products because it contains bacteria that help with digestion. WHAT FOODS ARE NOT RECOMMENDED? The following  are some foods and drinks that may worsen your symptoms:  Fatty foods, such as [place] fries.  Milk products, such as cheese or ice cream.  Chocolate.  Alcohol.  Products with caffeine, such as coffee.  Carbonated drinks, such as soda. The items listed above may not be a complete list of foods and beverages to avoid. Contact your dietitian for more information. WHAT FOODS ARE GOOD SOURCES OF FIBER? Your health care provider or dietitian may recommend that you eat more foods that contain fiber. Fiber can help reduce constipation and other IBS symptoms. Add foods with fiber to your diet a little at a time so that your body can get used to them. Too much fiber at once might cause gas and swelling of your abdomen. The following are some foods that are good sources of fiber:  Apples.  Peaches.  Pears.  Berries.  Figs.  Broccoli (raw).  Cabbage.  Carrots.  Raw peas.  Kidney beans.  Lima beans.  Whole grain bread.  Whole grain cereal. FOR MORE INFORMATION  International Foundation for Functional Gastrointestinal Disorders: www.iffgd.[employer] National Institute of Diabetes and Digestive and Kidney Diseases: http://norris-lawson.com/www.niddk.nih.gov/health-information/health-topics/digestive-diseases/ibs/Pages/facts.aspx   This information is not intended to replace advice given to you by your health care provider. Make sure you discuss any questions you have with your health care provider.   Document Released: 07/15/2003 Document Revised: 05/15/2014 Document Reviewed: 07/25/2013 Elsevier Interactive Patient Education [employer]2016 Elsevier Inc.

## 2015-04-27 NOTE — Assessment & Plan Note (Signed)
Longstanding history of, diagnosed in high school/college. Diarrhea type. Hasn't had flare up's in years, except for recently over the past 6 months.  Over past 3 months symptoms worse, and occuring 3 times weekly. (nausea, epigastric pain, diarrhea).  Doesn't seem to be GERD or anxiety related. She would like to be trialed on Donnatal as this improved symptoms in past.  Exam negative for RLQ tenderness or murphy's sign which is reassuring. Given symptoms and history will treat with short term trial of Donnatal. If symptoms persist will send her to GI for further evaluation.

## 2015-04-27 NOTE — Progress Notes (Signed)
Subjective:    Patient ID: Jenna Leblanc, female    DOB: Aug 12, 1980, 34 y.o.   MRN: [id number]  HPI  Jenna Leblanc is a 34 year old female who presents today with multiple complaints.  1) Sinus Pressure: Located to frontal sinuses and has been present for the past 10 days. She is blowing clear mucous from her nasal cavity. Overall her pressure has not improved and she's experienced dizziness over the past 3 days. She's taking Flonase with some improvement. Denies fevers and doesn't feel sickly.  2) Nausea: History of IBS diarrhea type. She underwent multiple studies in college and was diagnosed then. She's not had a flare up in years, but over the past 6 months has been bothered by her typical symptoms.   Her symptoms include nausea (before and after meals, mostly after meals), anxiety, diarrhea, occasional fevers, abdominal pain (cramping to epigastric region with burning sensation.   Over the past 3 months she's experienced 3 flare ups a week. She is aware of certain triggers. Denies increased stress, GERD, and symptoms of GAD. She's tried cutting back gluten, lowering sugar, lowering dairy, lowering alcohol, lower coffee consumption. Denies bloody stools. She was once taking Donnatal in college with improvement.  She takes gasax, probiotics, pepto, all with temporary relief. She's requesting to try the Donnatal again as this once worked before. She's not been evaluated by GI in years.   Review of Systems  Constitutional: Negative for fever and chills.  HENT: Positive for congestion and sinus pressure. Negative for ear pain and sore throat.        Ear fullness  Respiratory: Negative for cough and shortness of breath.   Cardiovascular: Negative for chest pain.  Gastrointestinal: Positive for nausea, abdominal pain and diarrhea.  Neurological: Positive for dizziness.       Past Medical History  Diagnosis Date  . Gestational diabetes mellitus, antepartum   . Gestational diabetes    diet controlled  . Kidney stones     none in 8 years  . IBS (irritable bowel syndrome)     Diarrhea type    Social History   Social History  . Marital Status: Married    Spouse Name: N/A  . Number of Children: 1  . Years of Education: N/A   Occupational History  . Real Therapist, occupationalestate broker    Social History Main Topics  . Smoking status: Former Smoker    Quit date: 07/15/2006  . Smokeless tobacco: Never Used  . Alcohol Use: 0.0 oz/week    0 Standard drinks or equivalent per week     Comment: 2-3 times per week  . Drug Use: No  . Sexual Activity:    Partners: Male    Birth Control/ Protection: None   Other Topics Concern  . Not on file   Social History Narrative    Past Surgical History  Procedure Laterality Date  . Lithotripsy      Family History  Problem Relation Age of Onset  . Cancer Sister     sarcoma in the uterus  . Cancer Maternal Grandmother     lung  . Diabetes Maternal Grandmother   . Heart attack Maternal Grandfather   . Pneumonia Paternal Grandmother   . Diabetes Paternal Grandmother   . Cancer Paternal Grandfather     ?spine    No Known Allergies  No current outpatient prescriptions on file prior to visit.   No current facility-administered medications on file prior to visit.    BP  112/76 mmHg  Pulse 72  Temp(Src) 98.8 F (37.1 C) (Oral)  Wt 149 lb (67.586 kg)  SpO2 96%  LMP 04/27/2015    Objective:   Physical Exam  Constitutional: She appears well-nourished.  HENT:  Right Ear: Tympanic membrane and ear canal normal.  Left Ear: Tympanic membrane and ear canal normal.  Nose: Right sinus exhibits frontal sinus tenderness. Right sinus exhibits no maxillary sinus tenderness. Left sinus exhibits frontal sinus tenderness. Left sinus exhibits no maxillary sinus tenderness.  Mouth/Throat: Oropharynx is clear and moist.  Neck: Neck supple.  Cardiovascular: Normal rate and regular rhythm.   Pulmonary/Chest: Effort normal and breath sounds  normal.  Abdominal: Soft. Bowel sounds are normal. There is tenderness in the epigastric area.  Skin: Skin is warm and dry.          Assessment & Plan:  Sinusitis:  Located to frontal sinuses x 10 days. Blowing clear mucous from nasal cavity. Denies fevers or feeling ill. Mostly just pressure. Doesn't appear sickly today, mild tenderness to frontal sinuses. Suspect viral involvement due to presentation. Will have her continue flonase and start Donnatal which will help to dry out sinus cavities. She is to update me if no improvement or if symptoms become worse.

## 2015-04-28 ENCOUNTER — Other Ambulatory Visit: Payer: Self-pay | Admitting: Primary Care

## 2015-04-28 DIAGNOSIS — K589 Irritable bowel syndrome without diarrhea: Secondary | ICD-10-CM

## 2015-04-28 MED ORDER — PB-HYOSCY-ATROPINE-SCOPOLAMINE 16.2 MG PO TABS
1.0000 | ORAL_TABLET | Freq: Three times a day (TID) | ORAL | Status: DC | PRN
Start: 2015-04-28 — End: 2016-03-21

## 2015-05-04 ENCOUNTER — Encounter: Payer: Self-pay | Admitting: Primary Care

## 2015-05-20 ENCOUNTER — Encounter: Payer: Self-pay | Admitting: Primary Care

## 2015-05-20 MED ORDER — AMOXICILLIN 500 MG PO TABS: 1000.0000 mg | ORAL_TABLET | Freq: Two times a day (BID) | ORAL | Status: DC

## 2015-06-04 ENCOUNTER — Encounter: Payer: Self-pay | Admitting: Family Medicine

## 2015-06-04 ENCOUNTER — Ambulatory Visit (INDEPENDENT_AMBULATORY_CARE_PROVIDER_SITE_OTHER): Payer: BLUE CROSS/BLUE SHIELD | Admitting: Family Medicine

## 2015-06-04 ENCOUNTER — Telehealth: Payer: Self-pay | Admitting: Internal Medicine

## 2015-06-04 VITALS — BP 118/68 | HR 77 | Temp 98.6°F | Wt 151.5 lb

## 2015-06-04 DIAGNOSIS — R1031 Right lower quadrant pain: Secondary | ICD-10-CM | POA: Diagnosis not present

## 2015-06-04 DIAGNOSIS — R1032 Left lower quadrant pain: Secondary | ICD-10-CM | POA: Diagnosis not present

## 2015-06-04 LAB — CBC WITH DIFFERENTIAL/PLATELET
BASOS ABS: 0 10*3/uL (ref 0.0–0.1)
Basophils Relative: 0 % (ref 0–1)
EOS PCT: 2 % (ref 0–5)
Eosinophils Absolute: 0.2 10*3/uL (ref 0.0–0.7)
HEMATOCRIT: 39.8 % (ref 36.0–46.0)
HEMOGLOBIN: 13.4 g/dL (ref 12.0–15.0)
LYMPHS PCT: 31 % (ref 12–46)
Lymphs Abs: 2.8 10*3/uL (ref 0.7–4.0)
MCH: 30.2 pg (ref 26.0–34.0)
MCHC: 33.7 g/dL (ref 30.0–36.0)
MCV: 89.6 fL (ref 78.0–100.0)
MPV: 10 fL (ref 8.6–12.4)
Monocytes Absolute: 0.8 10*3/uL (ref 0.1–1.0)
Monocytes Relative: 9 % (ref 3–12)
Neutro Abs: 5.2 10*3/uL (ref 1.7–7.7)
Neutrophils Relative %: 58 % (ref 43–77)
Platelets: 248 10*3/uL (ref 150–400)
RBC: 4.44 MIL/uL (ref 3.87–5.11)
RDW: 13.2 % (ref 11.5–15.5)
WBC: 9 10*3/uL (ref 4.0–10.5)

## 2015-06-04 LAB — POC URINALSYSI DIPSTICK (AUTOMATED)
Bilirubin, UA: NEGATIVE
Blood, UA: NEGATIVE
GLUCOSE UA: NEGATIVE
Ketones, UA: NEGATIVE
LEUKOCYTES UA: NEGATIVE
NITRITE UA: NEGATIVE
PROTEIN UA: NEGATIVE
SPEC GRAV UA: 1.025
UROBILINOGEN UA: 0.2
pH, UA: 6

## 2015-06-04 LAB — COMPREHENSIVE METABOLIC PANEL
ALT: 12 U/L (ref 6–29)
AST: 12 U/L (ref 10–30)
Albumin: 4.2 g/dL (ref 3.6–5.1)
Alkaline Phosphatase: 48 U/L (ref 33–115)
BILIRUBIN TOTAL: 0.3 mg/dL (ref 0.2–1.2)
BUN: 15 mg/dL (ref 7–25)
CALCIUM: 9 mg/dL (ref 8.6–10.2)
CHLORIDE: 102 mmol/L (ref 98–110)
CO2: 25 mmol/L (ref 20–31)
CREATININE: 0.59 mg/dL (ref 0.50–1.10)
GLUCOSE: 81 mg/dL (ref 65–99)
Potassium: 4.3 mmol/L (ref 3.5–5.3)
SODIUM: 137 mmol/L (ref 135–146)
Total Protein: 6.8 g/dL (ref 6.1–8.1)

## 2015-06-04 LAB — TSH: TSH: 1.679 u[IU]/mL (ref 0.350–4.500)

## 2015-06-04 LAB — LIPASE: Lipase: 18 U/L (ref 7–60)

## 2015-06-04 NOTE — Patient Instructions (Addendum)
labwork today. Urine looking ok. Increase fiber and water (handout provided). Exclude gas producing foods (beans, onions, celery, carrots, raisins, bananas, apricots, prunes, brussel sprouts, wheat germ, pretzels) Consider trail of lactose free diet (back off milk). Trial of align for bloating/IBS symptoms (OTC). Try donnatal prior to dinner. Update [place] in 2 weeks with how you're doing

## 2015-06-04 NOTE — Telephone Encounter (Signed)
Will see today.  

## 2015-06-04 NOTE — Telephone Encounter (Signed)
Pt has appt scheduled 06/04/15 at 2:45 with Dr Reece Agar.

## 2015-06-04 NOTE — Addendum Note (Signed)
Addended by: Desmond Dike on: 06/04/2015 04:41 PM   Modules accepted: Orders

## 2015-06-04 NOTE — Progress Notes (Signed)
Pre visit review using our clinic review tool, if applicable. No additional management support is needed unless otherwise documented below in the visit note. 

## 2015-06-04 NOTE — Progress Notes (Signed)
BP 118/68 mmHg  Pulse 77  Temp(Src) 98.6 F (37 C) (Oral)  Wt 151 lb 8 oz (68.72 kg)  SpO2 96%  LMP 05/16/2015  Breastfeeding? No   CC: wants referral for stomach issues Subjective:    Patient ID: Jenna Leblanc, female    DOB: 02-01-1981, 35 y.o.   MRN: [id number]  HPI: Jenna Leblanc is a 35 y.o. female presenting on 06/04/2015 for Flank Pain   Longstanding h/o IBS-D. Recently treated with donnatol for IBS which has helped. pepto bismol also helps. Has tried probiotic but not recently.  6 mo h/o abdominal pain worse in evenings and after meals described as lower abdominal cramping and dull ache, nausea, persistent diarrhea. This feels different from IBS. Currently constipated. ?kidney stone. No current fevers/chills, hematuria. Pain worse with caffeine and red meat and wine. Staying fatigued and with headache. No boring pain down to back. No unexpected weight changes.   This week nausea, flank pain, some diarrhea woke her up from sleep.  No increased stressors or lifestyle changes over last 6 months. Denies diet changes.  Recently treated with abx course for sinusitis (amox) did not complete this due to GI upset.   LMP 05/16/2015  Relevant past medical, surgical, family and social history reviewed and updated as indicated. Interim medical history since our last visit reviewed. Allergies and medications reviewed and updated. Current Outpatient Prescriptions on File Prior to Visit  Medication Sig  . belladona alk-PHENObarbital (DONNATAL) 16.2 MG tablet Take 1 tablet by mouth every 8 (eight) hours as needed.   No current facility-administered medications on file prior to visit.    Review of Systems Per HPI unless specifically indicated in ROS section     Objective:    BP 118/68 mmHg  Pulse 77  Temp(Src) 98.6 F (37 C) (Oral)  Wt 151 lb 8 oz (68.72 kg)  SpO2 96%  LMP 05/16/2015  Breastfeeding? No  Wt Readings from Last 3 Encounters:  06/04/15 151 lb 8 oz (68.72  kg)  04/27/15 149 lb (67.586 kg)  12/23/14 145 lb (65.772 kg)    Physical Exam  Constitutional: She appears well-developed and well-nourished. No distress.  HENT:  Mouth/Throat: Oropharynx is clear and moist. No oropharyngeal exudate.  Neck: Normal range of motion. Neck supple. No thyromegaly present.  Cardiovascular: Normal rate, regular rhythm, normal heart sounds and intact distal pulses.   No murmur heard. Pulmonary/Chest: Effort normal and breath sounds normal. No respiratory distress. She has no wheezes. She has no rales.  Abdominal: Soft. Normal appearance and bowel sounds are normal. She exhibits no distension and no mass. There is no hepatosplenomegaly. There is generalized tenderness (mild). There is no rigidity, no rebound, no guarding, no CVA tenderness and negative Murphy's sign.  Musculoskeletal: She exhibits no edema.  Lymphadenopathy:    She has no cervical adenopathy.  Skin: Skin is warm and dry. No rash noted.  Psychiatric: She has a normal mood and affect.  Nursing note and vitals reviewed.  Results for orders placed or performed in visit on 04/27/14  Glucose tolerance, 2 hours  Result Value Ref Range   Glucose, Fasting 86 70 - 99 mg/dL   Glucose, 2 hour 69 (L) 70 - 139 mg/dL      Assessment & Plan:   Problem List Items Addressed This Visit    Bilateral lower abdominal cramping - Primary    Ongoing symptoms for last 6 months, endorses different from IBS symptoms. Not consistent with GERD, biliary colic, or  acute abdomen.  ?worsening IBS type symptoms - recommend start align daily, see pt instructions for plan. Continue donnatal as needed, consider scheduled prior to dinner which is when she has worse symptoms. Check labs and urinalysis today to eval other causes of abd pain.       Relevant Orders   Comprehensive metabolic panel   TSH   CBC with Differential/Platelet   Lipase       Follow up plan: Return if symptoms worsen or fail to improve.

## 2015-06-04 NOTE — Assessment & Plan Note (Addendum)
Ongoing symptoms for last 6 months, endorses different from IBS symptoms. Not consistent with GERD, biliary colic, or acute abdomen.  ?worsening IBS type symptoms - recommend start align daily, see pt instructions for plan. Continue donnatal as needed, consider scheduled prior to dinner which is when she has worse symptoms. Check labs and urinalysis today to eval other causes of abd pain.

## 2015-06-04 NOTE — Telephone Encounter (Signed)
Patient Name: Jenna Leblanc  DOB: August 13, 1980    Initial Comment [name] States having lower back pain on right side, constipated, dizziness, nausea, very bad gas. pain and gas happens mainly at night and after meals   Nurse Assessment  Nurse: Sherilyn Cooter, RN, Thurmond Butts Date/Time Lamount Cohen Time): 06/04/2015 9:24:32 AM  Confirm and document reason for call. If symptomatic, describe symptoms. You must click the next button to save text entered. ---[name] states that she has had right lower back pain beginning yesterday. She has had kidney stones before. She has had nausea before also. She has been having gas and dizziness also. She rates her pain as 6-7 on 0-10 scale. The pain comes and goes. She states that the pain usually lasts 45 minutes to 1-1/2 hours. Denies burning and pain with urination. Denies fever.  Has the patient traveled out of the country within the last 30 days? ---No  Does the patient have any new or worsening symptoms? ---Yes  Will a triage be completed? ---Yes  Related visit to physician within the last 2 weeks? ---No  Does the PT have any chronic conditions? (i.e. diabetes, asthma, etc.) ---No  Did the patient indicate they were pregnant? ---No  Is this a behavioral health or substance abuse call? ---No     Guidelines    Guideline Title Affirmed Question Affirmed Notes  Flank Pain MODERATE pain (e.g., interferes with normal activities or awakens from sleep)    Final Disposition User   See Physician within 24 Hours Sherilyn Cooter, RN, Thurmond Butts    Comments  Dr. Alphonsus Sias did not have any appointments available today. Dr. Dion Saucier had an appointment available for 2:45pm today. Appointment scheduled.   Referrals  REFERRED TO PCP OFFICE   Disagree/Comply: Comply

## 2015-09-25 ENCOUNTER — Telehealth: Payer: BLUE CROSS/BLUE SHIELD | Admitting: Nurse Practitioner

## 2015-09-25 DIAGNOSIS — T148 Other injury of unspecified body region: Secondary | ICD-10-CM

## 2015-09-25 DIAGNOSIS — W57XXXA Bitten or stung by nonvenomous insect and other nonvenomous arthropods, initial encounter: Secondary | ICD-10-CM

## 2015-09-25 DIAGNOSIS — R112 Nausea with vomiting, unspecified: Secondary | ICD-10-CM

## 2015-09-25 MED ORDER — DOXYCYCLINE HYCLATE 100 MG PO TABS: 100.0000 mg | ORAL_TABLET | Freq: Two times a day (BID) | ORAL | Status: DC

## 2015-09-25 MED ORDER — ONDANSETRON HCL 4 MG PO TABS: 4.0000 mg | ORAL_TABLET | Freq: Three times a day (TID) | ORAL | Status: DC | PRN

## 2015-09-25 NOTE — Progress Notes (Signed)

## 2016-03-21 ENCOUNTER — Telehealth: Payer: BLUE CROSS/BLUE SHIELD | Admitting: Nurse Practitioner

## 2016-03-21 ENCOUNTER — Ambulatory Visit (INDEPENDENT_AMBULATORY_CARE_PROVIDER_SITE_OTHER): Payer: BLUE CROSS/BLUE SHIELD

## 2016-03-21 DIAGNOSIS — Z23 Encounter for immunization: Secondary | ICD-10-CM | POA: Diagnosis not present

## 2016-03-21 DIAGNOSIS — R197 Diarrhea, unspecified: Secondary | ICD-10-CM

## 2016-03-21 MED ORDER — PB-HYOSCY-ATROPINE-SCOPOLAMINE 16.2 MG PO TABS: 1.0000 | ORAL_TABLET | Freq: Three times a day (TID) | ORAL | 0 refills | Status: DC | PRN

## 2016-03-21 NOTE — Progress Notes (Signed)
We are sorry that you are not feeling well.  Here is how we plan to help!  Based on what you have shared with me it looks like you have Acute Infectious Diarrhea.  Most cases of acute diarrhea are due to infections with virus and bacteria and are self-limited conditions lasting less than 14 days.  For your symptoms you may take Imodium 2 mg tablets that are over the counter at your local pharmacy. Take two tablet now and then one after each loose stool up to 6 a day.  Antibiotics are not needed for most people with diarrhea.  Donnatol rx sent to pharmacy!  HOME CARE We recommend changing your diet to help with your symptoms for the next few days. Drink plenty of fluids that contain water salt and sugar. Sports drinks such as Gatorade may help.  You may try broths, soups, bananas, applesauce, soft breads, mashed potatoes or crackers.  You are considered infectious for as long as the diarrhea continues. Hand washing or use of alcohol based hand sanitizers is recommend. It is best to stay out of work or school until your symptoms stop.   GET HELP RIGHT AWAY If you have dark yellow colored urine or do not pass urine frequently you should drink more fluids.   If your symptoms worsen  If you feel like you are going to pass out (faint) You have a new problem  MAKE SURE YOU  Understand these instructions. Will watch your condition. Will get help right away if you are not doing well or get worse.  Your e-visit answers were reviewed by a board certified advanced clinical practitioner to complete your personal care plan.  Depending on the condition, your plan could have included both over the counter or prescription medications.  If there is a problem please reply  once you have received a response from your provider.  Your safety is important to us.  If you have drug allergies check your prescription carefully.    You can use MyChart to ask questions about today's visit, request a non-urgent  call back, or ask for a work or school excuse for 24 hours related to this e-Visit. If it has been greater than 24 hours you will need to follow up with your provider, or enter a new e-Visit to address those concerns.   You will get an e-mail in the next two days asking about your experience.  I hope that your e-visit has been valuable and will speed your recovery. Thank you for using e-visits.  ===View-only below this line===

## 2016-03-27 ENCOUNTER — Other Ambulatory Visit: Payer: Self-pay | Admitting: Primary Care

## 2016-03-27 DIAGNOSIS — K589 Irritable bowel syndrome without diarrhea: Secondary | ICD-10-CM

## 2016-03-27 NOTE — Telephone Encounter (Signed)
Ok to refill? Electronically refill request for   belladona alk-PHENObarbital (DONNATAL) 16.2 MG tablet  Last prescribed and seen on 04/28/2015 by Anda Kraft. Patient had a e-visit on 03/21/2016.

## 2016-03-27 NOTE — Telephone Encounter (Signed)
It looks like this was just prescribed a week ago. This is rarely used now--and certainly not appropriate for multiple times a day Please find out what is going on

## 2016-03-27 NOTE — Telephone Encounter (Signed)
Spoke to pt. She said Jenna Leblanc is transferring it to [facility] Jenna Leblanc

## 2016-06-07 ENCOUNTER — Ambulatory Visit (INDEPENDENT_AMBULATORY_CARE_PROVIDER_SITE_OTHER): Payer: BLUE CROSS/BLUE SHIELD | Admitting: Internal Medicine

## 2016-06-07 ENCOUNTER — Encounter: Payer: Self-pay | Admitting: Internal Medicine

## 2016-06-07 VITALS — BP 98/70 | HR 78 | Temp 98.5°F | Ht 63.0 in | Wt 131.2 lb

## 2016-06-07 DIAGNOSIS — K580 Irritable bowel syndrome with diarrhea: Secondary | ICD-10-CM | POA: Diagnosis not present

## 2016-06-07 DIAGNOSIS — Z0000 Encounter for general adult medical examination without abnormal findings: Secondary | ICD-10-CM | POA: Diagnosis not present

## 2016-06-07 DIAGNOSIS — Z8249 Family history of ischemic heart disease and other diseases of the circulatory system: Secondary | ICD-10-CM | POA: Diagnosis not present

## 2016-06-07 HISTORY — DX: Family history of ischemic heart disease and other diseases of the circulatory system: Z82.49

## 2016-06-07 MED ORDER — HYOSCYAMINE SULFATE 0.125 MG SL SUBL: 0.1250 mg | SUBLINGUAL_TABLET | SUBLINGUAL | 3 refills | Status: DC | PRN

## 2016-06-07 MED ORDER — ONDANSETRON HCL 4 MG PO TABS
4.0000 mg | ORAL_TABLET | Freq: Three times a day (TID) | ORAL | 0 refills | Status: DC | PRN
Start: 2016-06-07 — End: 2016-08-22

## 2016-06-07 NOTE — Assessment & Plan Note (Signed)
Will refill ondansetron and give Rx for hyoscamine

## 2016-06-07 NOTE — Assessment & Plan Note (Signed)
Healthy UTD on tetanus Discussed fitness

## 2016-06-07 NOTE — Progress Notes (Signed)
Pre visit review using our clinic review tool, if applicable. No additional management support is needed unless otherwise documented below in the visit note. 

## 2016-06-07 NOTE — Progress Notes (Signed)
Subjective:    Patient ID: Jenna Leblanc, female    DOB: 06/09/1980, 36 y.o.   MRN: [id number]  HPI Here for physical  She has concern about family cardiac issue Hypertrophic cardiomyopathy --- at least one person with apical aneurysm also Her father has it--not sure about others  Lots of stomach issues Uses donnatal and diet changes---helped for some time Lost a lot of weight with her keto diet--so went off this Gets abdominal pain--then nausea and diarrhea. This causes anxiety Not always after eating Known IBS Terrible fear of vomiting  Continues with gyn Condoms for birth control  Current Outpatient Prescriptions on File Prior to Visit  Medication Sig Dispense Refill  . belladona alk-PHENObarbital (DONNATAL) 16.2 MG tablet Take 1 tablet by mouth every 8 (eight) hours as needed. 30 tablet 0  . ondansetron (ZOFRAN) 4 MG tablet Take 1 tablet (4 mg total) by mouth every 8 (eight) hours as needed for nausea or vomiting. 20 tablet 0   No current facility-administered medications on file prior to visit.     No Known Allergies  Past Medical History:  Diagnosis Date  . Gestational diabetes    diet controlled  . IBS (irritable bowel syndrome)    Diarrhea type  . Kidney stones    none in 8 years    Past Surgical History:  Procedure Laterality Date  . LITHOTRIPSY    . VAGINAL DELIVERY      Family History  Problem Relation Age of Onset  . Cancer Sister     sarcoma in the uterus  . Cancer Maternal Grandmother     lung  . Diabetes Maternal Grandmother   . Heart attack Maternal Grandfather   . Pneumonia Paternal Grandmother   . Diabetes Paternal Grandmother   . Cancer Paternal Grandfather     ?spine    Social History   Social History  . Marital status: Married    Spouse name: N/A  . Number of children: 1  . Years of education: N/A   Occupational History  . [employer]    Social History Main Topics  . Smoking status: Former Smoker    Quit date:  07/15/2006  . Smokeless tobacco: Never Used  . Alcohol use 0.0 oz/week     Comment: 2-3 times per week  . Drug use: No  . Sexual activity: Yes    Partners: Male    Birth control/ protection: None, Condom   Other Topics Concern  . Not on file   Social History Narrative  . No narrative on file   Review of Systems  Constitutional: Negative for fatigue.       Happy with current weight  HENT: Positive for hearing loss. Negative for dental problem and tinnitus.        Keeps up with dentist  Eyes: Negative for visual disturbance.       No diplopia or unilateral vision loss  Respiratory: Negative for cough, chest tightness and shortness of breath.   Cardiovascular: Negative for chest pain and leg swelling.       Palpitations only with nerves  Gastrointestinal: Positive for abdominal pain, diarrhea and nausea.  Endocrine: Negative for polydipsia and polyuria.  Genitourinary: Negative for dyspareunia, dysuria and frequency.  Musculoskeletal: Negative for arthralgias, back pain and joint swelling.  Skin: Negative for rash.  Allergic/Immunologic: Positive for environmental allergies. Negative for immunocompromised state.       Occ zyrtec  Neurological: Negative for syncope, light-headedness and headaches.  Hematological: Negative  for adenopathy. Does not bruise/bleed easily.  Psychiatric/Behavioral: Negative for dysphoric mood and sleep disturbance. The patient is nervous/anxious.        Objective:   Physical Exam  Constitutional: She is oriented to person, place, and time. She appears well-developed and well-nourished. No distress.  HENT:  Head: Normocephalic and atraumatic.  Right Ear: External ear normal.  Left Ear: External ear normal.  Mouth/Throat: Oropharynx is clear and moist. No oropharyngeal exudate.  Eyes: Conjunctivae are normal. Pupils are equal, round, and reactive to light.  Neck: Normal range of motion. Neck supple. No thyromegaly present.  Cardiovascular: Normal  rate, regular rhythm, normal heart sounds and intact distal pulses.  Exam reveals no gallop and no friction rub.   No murmur heard. Pulmonary/Chest: Effort normal and breath sounds normal. No respiratory distress. She has no wheezes. She has no rales.  Abdominal: Soft. There is no tenderness.  Musculoskeletal: She exhibits no edema or tenderness.  Lymphadenopathy:    She has no cervical adenopathy.  Neurological: She is alert and oriented to person, place, and time.  Skin: No rash noted. No erythema.  Psychiatric: She has a normal mood and affect. Her behavior is normal.          Assessment & Plan:

## 2016-06-07 NOTE — Assessment & Plan Note (Signed)
In father Will check echo

## 2016-06-15 ENCOUNTER — Ambulatory Visit (HOSPITAL_COMMUNITY): Payer: BLUE CROSS/BLUE SHIELD | Attending: Internal Medicine

## 2016-06-15 ENCOUNTER — Other Ambulatory Visit: Payer: Self-pay

## 2016-06-15 DIAGNOSIS — Z87891 Personal history of nicotine dependence: Secondary | ICD-10-CM | POA: Insufficient documentation

## 2016-06-15 DIAGNOSIS — Z8249 Family history of ischemic heart disease and other diseases of the circulatory system: Secondary | ICD-10-CM

## 2016-07-14 ENCOUNTER — Encounter: Payer: Self-pay | Admitting: Internal Medicine

## 2016-08-07 ENCOUNTER — Telehealth: Payer: Self-pay | Admitting: Internal Medicine

## 2016-08-07 NOTE — Telephone Encounter (Signed)
Pt called to check on status of a previous call. She was referred to cardiologist but was billed $1600 from echo. She was told to check coding from PCP. Could you call her back to discuss.

## 2016-08-10 NOTE — Telephone Encounter (Signed)
Let message for patient to call if any questions.  Pt given number for billing and coding.

## 2016-08-10 NOTE — Telephone Encounter (Signed)
Pt called back and was concerned about the bill.  I explained that while PCP did refer her for procedure, it was at her request and she is responsible for the bill to [place].  She is going to call [facility] to set up a payment plan and call insurance to see if they will reconsider the charges because of her family history.

## 2016-08-22 ENCOUNTER — Other Ambulatory Visit: Payer: Self-pay | Admitting: Internal Medicine

## 2016-08-22 ENCOUNTER — Other Ambulatory Visit: Payer: Self-pay | Admitting: Nurse Practitioner

## 2016-08-23 MED ORDER — ONDANSETRON HCL 4 MG PO TABS: 4.0000 mg | ORAL_TABLET | Freq: Three times a day (TID) | ORAL | 0 refills | Status: DC | PRN

## 2016-10-01 ENCOUNTER — Encounter: Payer: Self-pay | Admitting: Internal Medicine

## 2016-10-08 ENCOUNTER — Other Ambulatory Visit: Payer: Self-pay | Admitting: Internal Medicine

## 2016-10-08 ENCOUNTER — Other Ambulatory Visit: Payer: Self-pay | Admitting: Nurse Practitioner

## 2016-10-09 ENCOUNTER — Other Ambulatory Visit: Payer: Self-pay | Admitting: Nurse Practitioner

## 2016-10-09 MED ORDER — ONDANSETRON HCL 4 MG PO TABS: 4.0000 mg | ORAL_TABLET | Freq: Three times a day (TID) | ORAL | 0 refills | Status: DC | PRN

## 2016-10-09 MED ORDER — PB-HYOSCY-ATROPINE-SCOPOLAMINE 16.2 MG PO TABS: 1.0000 | ORAL_TABLET | Freq: Three times a day (TID) | ORAL | 0 refills | Status: DC | PRN

## 2017-02-09 ENCOUNTER — Ambulatory Visit (INDEPENDENT_AMBULATORY_CARE_PROVIDER_SITE_OTHER): Payer: BLUE CROSS/BLUE SHIELD

## 2017-02-09 DIAGNOSIS — Z23 Encounter for immunization: Secondary | ICD-10-CM

## 2017-02-17 ENCOUNTER — Encounter: Payer: Self-pay | Admitting: Internal Medicine

## 2017-02-17 ENCOUNTER — Telehealth: Payer: BLUE CROSS/BLUE SHIELD | Admitting: Nurse Practitioner

## 2017-02-17 NOTE — Progress Notes (Signed)
For the accuracy of medical information and documentation you must be the actual patient logged in to their MyChart account to fill out the eVisit symptoms questionnaire.  If you are not the patient, please have the patient log in and complete the eVisit.   I am so sorry but you must be 18 years of agein order to do an e visit

## 2017-03-07 ENCOUNTER — Telehealth: Payer: BLUE CROSS/BLUE SHIELD | Admitting: Family

## 2017-03-07 DIAGNOSIS — R6889 Other general symptoms and signs: Secondary | ICD-10-CM

## 2017-03-07 MED ORDER — OSELTAMIVIR PHOSPHATE 75 MG PO CAPS: 75.0000 mg | ORAL_CAPSULE | Freq: Two times a day (BID) | ORAL | 0 refills | Status: DC

## 2017-03-07 NOTE — Progress Notes (Signed)
E visit for Flu like symptoms   We are sorry that you are not feeling well.  Here is how we plan to help! Based on what you have shared with me it looks like you may have a respiratory virus that may be influenza.   You had stated you feel short of breath. If this continues or worsens you need to go to the Emergency Department.   Influenza or "the flu" is   an infection caused by a respiratory virus. The flu virus is highly contagious and persons who did not receive their yearly flu vaccination may "catch" the flu from close contact.  We have anti-viral medications to treat the viruses that cause this infection. They are not a "cure" and only shorten the course of the infection. These prescriptions are most effective when they are given within the first 2 days of "flu" symptoms. Antiviral medication are indicated if you have a high risk of complications from the flu. You should  also consider an antiviral medication if you are in close contact with someone who is at risk. These medications can help patients avoid complications from the flu  but have side effects that you should know. Possible side effects from Tamiflu or oseltamivir include nausea, vomiting, diarrhea, dizziness, headaches, eye redness, sleep problems or other respiratory symptoms. You should not take Tamiflu if you have an allergy to oseltamivir or any to the ingredients in Tamiflu.  Based upon your symptoms and potential risk factors I have prescribed Oseltamivir (Tamiflu).  It has been sent to your designated pharmacy.  You will take one 75 mg capsule orally twice a day for the next 5 days.  ANYONE WHO HAS FLU SYMPTOMS SHOULD: . Stay home. The flu is highly contagious and going out or to work exposes others! . Be sure to drink plenty of fluids. Water is fine as well as fruit juices, sodas and electrolyte beverages. You may want to stay away from caffeine or alcohol. If you are nauseated, try taking small sips of liquids. How do you  know if you are getting enough fluid? Your urine should be a pale yellow or almost colorless. . Get rest. . Taking a steamy shower or using a humidifier may help nasal congestion and ease sore throat pain. Using a saline nasal spray works much the same way. . Cough drops, hard candies and sore throat lozenges may ease your cough. . Line up a caregiver. Have someone check on you regularly.   GET HELP RIGHT AWAY IF: . You cannot keep down liquids or your medications. . You become short of breath . Your fell like you are going to pass out or loose consciousness. . Your symptoms persist after you have completed your treatment plan MAKE SURE YOU   Understand these instructions.  Will watch your condition.  Will get help right away if you are not doing well or get worse.  Your e-visit answers were reviewed by a board certified advanced clinical practitioner to complete your personal care plan.  Depending on the condition, your plan could have included both over the counter or prescription medications.  If there is a problem please reply  once you have received a response from your provider.  Your safety is important to us.  If you have drug allergies check your prescription carefully.    You can use MyChart to ask questions about today's visit, request a non-urgent call back, or ask for a work or school excuse for 24 hours related to  this e-Visit. If it has been greater than 24 hours you will need to follow up with your provider, or enter a new e-Visit to address those concerns.  You will get an e-mail in the next two days asking about your experience.  I hope that your e-visit has been valuable and will speed your recovery. Thank you for using e-visits.   

## 2017-03-09 ENCOUNTER — Ambulatory Visit (INDEPENDENT_AMBULATORY_CARE_PROVIDER_SITE_OTHER): Payer: BLUE CROSS/BLUE SHIELD | Admitting: Family Medicine

## 2017-03-09 ENCOUNTER — Ambulatory Visit: Payer: Self-pay | Admitting: *Deleted

## 2017-03-09 ENCOUNTER — Encounter: Payer: Self-pay | Admitting: Family Medicine

## 2017-03-09 VITALS — BP 112/68 | HR 89 | Temp 98.5°F | Ht 63.0 in | Wt 141.0 lb

## 2017-03-09 DIAGNOSIS — R6889 Other general symptoms and signs: Secondary | ICD-10-CM | POA: Insufficient documentation

## 2017-03-09 LAB — POC INFLUENZA A&B (BINAX/QUICKVUE)
Influenza A, POC: NEGATIVE
Influenza B, POC: NEGATIVE

## 2017-03-09 NOTE — Patient Instructions (Signed)
Thank you for coming in,   Please try things such as zyrtec-D or allegra-D which is an antihistamine and decongestant.   Please try afrin which will help with nasal congestion but use for only three days.   Please also try using a netti pot on a regular occasion.  Honey can help with a sore throat.      Please feel free to call with any questions or concerns at any time, at [phone]. --Dr. Andi Layfield  

## 2017-03-09 NOTE — Telephone Encounter (Signed)
Pt   Was   Seen   Several  Days  Ago   For  Poss  Flu  With   An  E  Visit  Was  rx  tamiflu     Pt   States   She  Is  Not better   Reason for Disposition . Fever present > 3 days (72 hours)  Answer Assessment - Initial Assessment Questions 1. TEMPERATURE: "What is the most recent temperature?"  "How was it measured?"     4  HOURS   2. ONSET: "When did the fever start?" 3   DAYS  AGO    3. SYMPTOMS: "Do you have any other symptoms besides the fever?"  (e.g., colds, headache, sore throat, earache, cough, rash, diarrhea, vomiting, abdominal pain)    SINUS  HEADACHE   SORETHROAT   CHILLS   AND  BODYACHES  EARLIER 4. CAUSE: If there are no symptoms, ask: "What do you think is causing the fever?"     DIAGNOSED   WITH  FLU  RECENTLY   PRODUCTIVE   COUGH  NOW  5. CONTACTS: "Does anyone else in the family have an infection?"      CHILD  RECENTLY  HAD  EAR INFECTION   6. TREATMENT: "What have you done so far to treat this fever?" (e.g., medications)    ANTI  PYRETICS   TAMIFLU    7. IMMUNOCOMPROMISE: "Do you have of the following: diabetes, HIV positive, splenectomy, cancer chemotherapy, chronic steroid treatment, transplant patient, etc."    NONE 8. PREGNANCY: "Is there any chance you are pregnant?" "When was your last menstrual period?"     OCT   16     9. TRAVEL: "Have you traveled out of the country in the last month?" (e.g., travel history, exposures)     NO  Protocols used: FEVER-A-AH

## 2017-03-09 NOTE — Telephone Encounter (Signed)
Copied from CRM [id number]. Topic: Quick Communication - See Telephone Encounter >> Mar 09, 2017 10:57 AM Rudi CocoLathan, Porche Steinberger M, NT wrote: CRM for notification. See Telephone encounter for:  03/09/17. Pt. Did e visit was given med. But doesn't feel better would like to talk with nurse

## 2017-03-09 NOTE — Assessment & Plan Note (Signed)
Flu test today is negative. Symptoms still suggestive of flu - Counseled on supportive care. Can stop Tamiflu if she would like - Advised to follow-up if still symptomatic over a week. May need to treat with antibiotic if that is the case.

## 2017-03-09 NOTE — Progress Notes (Signed)
Jenna Leblanc - 36 y.o. female MRN [id number]  Date of birth: 11-24-1980  SUBJECTIVE:  Including CC & ROS.  Chief Complaint  Patient presents with  . Influenza    Jenna was diagnosed by E-visit on 03/07/17. Jenna was prescribed Tamiflu, Jenna has chills and running a fever this morning it was 102. Her throat is sore.      Jenna Leblanc is a 36 y.o. female that is presenting with fever, body aches, and chills. Her symptoms of occurring for 3 days. Jenna feels like Jenna has had some improvement of her symptoms. Jenna has been started on Tamiflu cannot notice much of a difference. Has been taken ibuprofen and Tylenol for the fever. Denies any significant coughing. Has not had any rashes. No recent travel. Has had sick contacts. No ear pain.  Patient had an e- visit on 10/31 and was diagnosed with influenza. Tamiflu was prescribed at that encounter.   Review of Systems  Constitutional: Positive for fever.  HENT: Positive for sinus pressure.   Respiratory: Positive for cough.     HISTORY: Past Medical, Surgical, Social, and Family History Reviewed & Updated per EMR.   Pertinent Historical Findings include:  Past Medical History:  Diagnosis Date  . Gestational diabetes    diet controlled  . IBS (irritable bowel syndrome)    Diarrhea type  . Kidney stones    none in 8 years    Past Surgical History:  Procedure Laterality Date  . LITHOTRIPSY    . VAGINAL DELIVERY      No Known Allergies  Family History  Problem Relation Age of Onset  . Cancer Sister        sarcoma in the uterus  . Cancer Maternal Grandmother        lung  . Diabetes Maternal Grandmother   . Heart attack Maternal Grandfather   . Pneumonia Paternal Grandmother   . Diabetes Paternal Grandmother   . Cancer Paternal Grandfather        ?spine     Social History   Social History  . Marital status: Married    Spouse name: N/A  . Number of children: 1  . Years of education: N/A   Occupational History  . Real  Therapist, occupationalestate broker    Social History Main Topics  . Smoking status: Former Smoker    Quit date: 07/15/2006  . Smokeless tobacco: Never Used  . Alcohol use 0.0 oz/week     Comment: 2-3 times per week  . Drug use: No  . Sexual activity: Yes    Partners: Male    Birth control/ protection: None, Condom   Other Topics Concern  . Not on file   Social History Narrative  . No narrative on file     PHYSICAL EXAM:  VS: BP 112/68 (BP Location: Left Arm, Patient Position: Sitting, Cuff Size: Normal)   Pulse 89   Temp 98.5 F (36.9 C) (Oral)   Ht 5\' 3"  (1.6 m)   Wt 141 lb (64 kg)   SpO2 99%   BMI 24.98 kg/m  Physical Exam Gen: NAD, alert, cooperative with exam, ENT: normal lips, normal nasal mucosa, tympanic memories clear and intact bilaterally, no cervical lymphadenopathy, normal oropharynx Eye: normal EOM, normal conjunctiva and lids CV:  no edema, +2 pedal pulses, S1-S2, regular rate and rhythm   Resp: no accessory muscle use, non-labored, clear to auscultation bilaterally, no crackles or wheezes Skin: no rashes, no areas of induration  Neuro: normal tone, normal sensation  to touch Psych:  normal insight, alert and oriented MSK: Normal gait, normal strength      ASSESSMENT & PLAN:   Flu-like symptoms Flu test today is negative. Symptoms still suggestive of flu - Counseled on supportive care. Can stop Tamiflu if Jenna would like - Advised to follow-up if still symptomatic over a week. May need to treat with antibiotic if that is the case.

## 2017-04-27 ENCOUNTER — Other Ambulatory Visit: Payer: Self-pay

## 2017-04-27 ENCOUNTER — Ambulatory Visit: Payer: Self-pay | Admitting: Family Medicine

## 2017-04-27 MED ORDER — ONDANSETRON HCL 4 MG PO TABS: 4.0000 mg | ORAL_TABLET | Freq: Three times a day (TID) | ORAL | 0 refills | Status: DC | PRN

## 2017-04-27 MED ORDER — HYOSCYAMINE SULFATE 0.125 MG SL SUBL: 0.1250 mg | SUBLINGUAL_TABLET | SUBLINGUAL | 3 refills | Status: DC | PRN

## 2017-07-30 ENCOUNTER — Other Ambulatory Visit: Payer: Self-pay | Admitting: Internal Medicine

## 2017-07-30 ENCOUNTER — Encounter: Payer: Self-pay | Admitting: Internal Medicine

## 2017-07-31 MED ORDER — ONDANSETRON HCL 4 MG PO TABS: 4.0000 mg | ORAL_TABLET | Freq: Three times a day (TID) | ORAL | 0 refills | Status: DC | PRN

## 2017-07-31 MED ORDER — HYOSCYAMINE SULFATE 0.125 MG SL SUBL: 0.1250 mg | SUBLINGUAL_TABLET | SUBLINGUAL | 3 refills | Status: DC | PRN

## 2017-07-31 MED ORDER — MECLIZINE HCL 25 MG PO TABS: 25.0000 mg | ORAL_TABLET | Freq: Three times a day (TID) | ORAL | 1 refills | Status: DC | PRN

## 2017-07-31 NOTE — Telephone Encounter (Signed)
Approved: okay #20 x 0 for ondansetron Also, meclizine 25mg  #60 x 0 1 tid prn vertigo/dizziness

## 2017-07-31 NOTE — Telephone Encounter (Signed)
Rxs sent electronically.  

## 2017-12-11 ENCOUNTER — Other Ambulatory Visit: Payer: Self-pay | Admitting: Internal Medicine

## 2017-12-15 ENCOUNTER — Telehealth: Payer: BLUE CROSS/BLUE SHIELD | Admitting: Physician Assistant

## 2017-12-15 NOTE — Progress Notes (Signed)
Unfortunately we are unable to complete this e-visit.  This e-visit is not for you and is submitted through your MyChart. E-visits must be submitted through a valid chart/MyChart account. If the person who is ill has a MyChart, please submit a request through their portal. If the person is a minor, they will need to be seen in person as we do not do e-visits for children. Please make sure she gets the care needed. I see your primary care provider is Dr. Alphonsus SiasLetvak. If Sheria LangCameron also sees him, you may want to call the  line to have her seen this morning at their Saturday [facility] on [address]   If you are having a true medical emergency please call 911.  If you need an urgent face to face visit, [facility] has four urgent care centers for your convenience.  If you need care fast and have a high deductible or no insurance consider:   [email or web address] to reserve your spot online an avoid wait times  [facility] [address], [address] [place], [place] [address] 8 am to 8 pm Monday-Friday 10 am to 4 pm Saturday-Sunday *Across the street from [employer]  [facility] [employer]  [address] [place], [address] 8 am to 5 pm Monday-Friday * In the [facility] on the [facility]   The following sites will take your  insurance:  . [facility]  [phone] Get Driving Directions Find a Provider at this Location  [address] [place], [place] [address] . 10 am to 8 pm Monday-Friday . 12 pm to 8 pm Saturday-Sunday   . [facility]   [phone] Get Driving Directions Find a Provider at this Location  [address], [address] [place], [place] [address] . 8 am to 8 pm Monday-Friday . 9 am to 6 pm Saturday . 11 am to 6 pm Sunday   . [facility] [place]  [phone] Get Driving Directions  [address] [place], [place] [address] . 8 am to 8 pm  Monday-Friday . 8 am to 4 pm Saturday-Sunday   Your e-visit answers were reviewed by a board certified advanced clinical practitioner to complete your personal care plan.  Thank you for using e-Visits.

## 2018-02-08 ENCOUNTER — Other Ambulatory Visit: Payer: Self-pay | Admitting: Internal Medicine

## 2018-02-14 ENCOUNTER — Ambulatory Visit (INDEPENDENT_AMBULATORY_CARE_PROVIDER_SITE_OTHER): Payer: 59

## 2018-02-14 DIAGNOSIS — Z23 Encounter for immunization: Secondary | ICD-10-CM | POA: Diagnosis not present

## 2018-04-26 ENCOUNTER — Encounter: Payer: Self-pay | Admitting: Family Medicine

## 2018-04-26 ENCOUNTER — Ambulatory Visit (INDEPENDENT_AMBULATORY_CARE_PROVIDER_SITE_OTHER): Payer: 59 | Admitting: Family Medicine

## 2018-04-26 ENCOUNTER — Other Ambulatory Visit: Payer: Self-pay | Admitting: Internal Medicine

## 2018-04-26 VITALS — BP 98/62 | HR 80 | Temp 98.1°F | Ht 63.0 in | Wt 139.0 lb

## 2018-04-26 DIAGNOSIS — J069 Acute upper respiratory infection, unspecified: Secondary | ICD-10-CM | POA: Diagnosis not present

## 2018-04-26 DIAGNOSIS — J029 Acute pharyngitis, unspecified: Secondary | ICD-10-CM | POA: Diagnosis not present

## 2018-04-26 DIAGNOSIS — B9789 Other viral agents as the cause of diseases classified elsewhere: Secondary | ICD-10-CM | POA: Diagnosis not present

## 2018-04-26 LAB — POCT RAPID STREP A (OFFICE): Rapid Strep A Screen: NEGATIVE

## 2018-04-26 NOTE — Progress Notes (Signed)
Subjective:    Patient ID: Raye SorrowKriston G Cobbs, female    DOB: [birth year], 37 y.o.   MRN: [id number]  HPI This is a 37 yo female who presents today with cough and sore throat x 1 week. Had a fever blister in her nose, took some vatrex. Morning are worst part, voice hoarse. Feels better as day goes on.  About a month ago had a cough with sneezing, cough persisted. Took some decongestant this am with relief. No fever, no aches, no wheeze or SOB.  Green and bloody nasal drainage. Has history of seasonal allergies. Rarely takes allergy meds. No headache or ear pain.   Past Medical History:  Diagnosis Date  . Gestational diabetes    diet controlled  . IBS (irritable bowel syndrome)    Diarrhea type  . Kidney stones    none in 8 years   Past Surgical History:  Procedure Laterality Date  . LITHOTRIPSY    . VAGINAL DELIVERY     Family History  Problem Relation Age of Onset  . Cancer Sister        sarcoma in the uterus  . Cancer Maternal Grandmother        lung  . Diabetes Maternal Grandmother   . Heart attack Maternal Grandfather   . Pneumonia Paternal Grandmother   . Diabetes Paternal Grandmother   . Cancer Paternal Grandfather        ?spine   Social History   Tobacco Use  . Smoking status: Former Smoker    Last attempt to quit: 07/15/2006    Years since quitting: 11.7  . Smokeless tobacco: Never Used  Substance Use Topics  . Alcohol use: Yes    Alcohol/week: 0.0 standard drinks    Comment: 2-3 times per week  . Drug use: No      Review of Systems Per HPI    Objective:   Physical Exam Vitals signs reviewed.  Constitutional:      General: She is not in acute distress.    Appearance: She is well-developed and normal weight. She is not ill-appearing or toxic-appearing.  HENT:     Head: Normocephalic and atraumatic.     Right Ear: Tympanic membrane and ear canal normal.     Left Ear: Tympanic membrane and ear canal normal.     Nose: No congestion or rhinorrhea.    Mouth/Throat:     Mouth: Mucous membranes are moist.     Tonsils: No tonsillar exudate or tonsillar abscesses.  Eyes:     Conjunctiva/sclera: Conjunctivae normal.  Neck:     Musculoskeletal: Normal range of motion and neck supple.  Cardiovascular:     Rate and Rhythm: Normal rate and regular rhythm.     Heart sounds: Normal heart sounds.  Pulmonary:     Effort: Pulmonary effort is normal.     Breath sounds: Normal breath sounds.  Lymphadenopathy:     Cervical: No cervical adenopathy.  Skin:    General: Skin is warm and dry.  Neurological:     Mental Status: She is alert and oriented to person, place, and time.  Psychiatric:        Mood and Affect: Mood normal.        Behavior: Behavior normal.       BP 98/62 (BP Location: Right Arm)   Pulse 80   Temp 98.1 F (36.7 C) (Oral)   Ht 5\' 3"  (1.6 m)   Wt 139 lb (63 kg)   LMP 04/09/2018  SpO2 98%   BMI 24.62 kg/m  Wt Readings from Last 3 Encounters:  04/26/18 139 lb (63 kg)  03/09/17 141 lb (64 kg)  06/07/16 131 lb 4 oz (59.5 kg)   Results for orders placed or performed in visit on 04/26/18  Rapid Strep A  Result Value Ref Range   Rapid Strep A Screen Negative Negative       Assessment & Plan:  1. Sore throat - Rapid Strep A- negative  2. Viral URI with cough - Provided written and verbal information regarding diagnosis and treatment. -  Patient Instructions  For nasal congestion you can use Afrin nasal spray for 3 days max, Sudafed, saline nasal spray (generic is fine for all). For cough you can try Delsym. Drink enough fluids to make your urine light yellow. For fever/chill/muscle aches you can take over the counter acetaminophen or ibuprofen.  Please come back in if you are not better in 5-7 days or if you develop wheezing, shortness of breath or persistent vomiting.       Olean Reeeborah Ellerie Arenz, FNP-BC  [facility], [facility]  04/26/2018 4:34 PM

## 2018-04-26 NOTE — Patient Instructions (Signed)
For nasal congestion you can use Afrin nasal spray for 3 days max, Sudafed, saline nasal spray (generic is fine for all). For cough you can try Delsym. Drink enough fluids to make your urine light yellow. For fever/chill/muscle aches you can take over the counter acetaminophen or ibuprofen.  Please come back in if you are not better in 5-7 days or if you develop wheezing, shortness of breath or persistent vomiting.   

## 2018-04-30 ENCOUNTER — Other Ambulatory Visit: Payer: Self-pay | Admitting: Internal Medicine

## 2018-05-17 LAB — HM PAP SMEAR: HM Pap smear: NOT DETECTED

## 2018-07-18 ENCOUNTER — Other Ambulatory Visit: Payer: Self-pay | Admitting: Internal Medicine

## 2018-07-19 MED ORDER — VALACYCLOVIR HCL 1 G PO TABS: 2000.0000 mg | ORAL_TABLET | Freq: Once | ORAL | 1 refills | Status: DC

## 2018-11-02 ENCOUNTER — Other Ambulatory Visit: Payer: Self-pay | Admitting: Internal Medicine

## 2018-11-22 ENCOUNTER — Telehealth: Payer: 59

## 2018-11-26 ENCOUNTER — Other Ambulatory Visit: Payer: Self-pay

## 2018-11-26 ENCOUNTER — Telehealth: Payer: Self-pay | Admitting: Internal Medicine

## 2018-11-26 DIAGNOSIS — Z20828 Contact with and (suspected) exposure to other viral communicable diseases: Secondary | ICD-10-CM

## 2018-11-26 DIAGNOSIS — Z20822 Contact with and (suspected) exposure to covid-19: Secondary | ICD-10-CM

## 2018-11-26 NOTE — Telephone Encounter (Signed)
Pt returned your call Best number [phone] pt stated you can either call her or send her a my chart message  She stated it has been 6 days since she was around the positive covid person.   She wants to know if she needs to talk the test since her 14 days will be up before test results will be back     What does her spouse and child need to do?

## 2018-11-26 NOTE — Telephone Encounter (Signed)
Test ordered Please direct her to the nearest testing center---let her know about result delays and need to quarantine till results in

## 2018-11-26 NOTE — Telephone Encounter (Signed)
Patient called and said she was exposed to someone who stopped by her office last Wednesday and talked to her for 10 - 15 minutes.  She just found out they tested positive for Covid 19. Patient wants to be tested for Covid 19.

## 2018-11-26 NOTE — Telephone Encounter (Signed)
Left a detailed massage on VM advising of different sites to be tested. Advised her to quarantine until the results come back and that it could be 7-10 days to get them back.

## 2018-11-26 NOTE — Telephone Encounter (Signed)
Spoke to pt. Her thought process is that she was exposed to that person 6 days ago, but has not completely quarantined herself, but she does wear a mask everywhere she goes. Her thought is that if it is taking 7-10- days to the results back , then she will have reached the 14 day quarantine time.   Also, I advised her that her family would have to wait until ger results came back before testing them so do not inundate them with so many tests. So, her question for them goes along with hers about the 14 days quarantine being over before receiving results.

## 2018-11-27 NOTE — Telephone Encounter (Signed)
That is the problem with the delay in results. It is up to her whether she gets tested, but she will need to quarantine regardless

## 2018-11-27 NOTE — Telephone Encounter (Signed)
Spoke to pt. She did get tested yesterday.

## 2018-11-29 LAB — NOVEL CORONAVIRUS, NAA: SARS-CoV-2, NAA: NOT DETECTED

## 2019-03-14 ENCOUNTER — Telehealth: Payer: Self-pay | Admitting: Internal Medicine

## 2019-03-17 NOTE — Telephone Encounter (Signed)
Please have her set up for PE in the next few months

## 2019-03-17 NOTE — Telephone Encounter (Signed)
Pt was last seen 04-26-18 for acute issue Last CPE/OV 06-07-16 [facility]

## 2019-03-17 NOTE — Telephone Encounter (Signed)
Patient scheduled cpx on 05/21/19.

## 2019-05-09 NOTE — L&D Delivery Note (Addendum)
LABOR COURSE Admitted for spontaneous onset of labor after her water broke around 0430 and started having contractions every 3-4 minutes. She progressed to complete without augmentation.   Delivery Note Called to room and patient was complete. Pushing initiated at 1033. Good maternal effort, no progress with delivery of fetal head. Fetal bradycardia to 70s noted after approximately 20 minutes of pushing. Dr. Crissie Reese notified at 1059. Patient repositioned with left hip tilt, pushing instructions restated. Slightly disorganized but overall good maternal effort. Fetal bradycardia again noted. Dr. Crissie Reese requested at bedside, present within two minutes of phone call. Patient prepped for vacuum-assisted vaginal delivery.  Head delivered ROT with vacuum applied by Dr. Crissie Reese. No nuchal cord present. SNM and CNM resumed position at perineum. Shoulder and body delivered in usual fashion. At 1114 a viable female was delivered via brief application of vacuum.  Infant with spontaneous cry, placed on mother's abdomen, dried and stimulated. Cord clamped x 2 after 1 minute delay, and cut by FOB. Cord blood drawn. Placenta delivered spontaneously with gentle cord traction. Appears intact. Fundus firm with massage and Pitocin. Labia, perineum, vagina, and cervix inspected.    APGAR: 8,9 ; weight 3620 grams .   Cord: 3VC with the no complications.    Anesthesia: Epidural & local lidocaine for repair of perineum   Episiotomy: None Lacerations: 2nd degree perineal (repaired) & left labial abrasion (not repaired) Suture Repair: 3.0 vicryl rapide Est. Blood Loss (mL): 100  Mom to postpartum.  Baby to Couplet care / Skin to Skin.  Clayton Bibles, MSN, CNM Certified Nurse Midwife, [facility] for [employer], [facility] 03/22/20 12:48 PM

## 2019-05-12 ENCOUNTER — Other Ambulatory Visit: Payer: 59

## 2019-05-21 ENCOUNTER — Encounter: Payer: Self-pay | Admitting: Internal Medicine

## 2019-05-21 ENCOUNTER — Ambulatory Visit (INDEPENDENT_AMBULATORY_CARE_PROVIDER_SITE_OTHER): Payer: PRIVATE HEALTH INSURANCE | Admitting: Internal Medicine

## 2019-05-21 ENCOUNTER — Other Ambulatory Visit: Payer: Self-pay

## 2019-05-21 VITALS — BP 118/76 | HR 69 | Temp 98.4°F | Ht 63.0 in | Wt 142.5 lb

## 2019-05-21 DIAGNOSIS — Z0000 Encounter for general adult medical examination without abnormal findings: Secondary | ICD-10-CM | POA: Diagnosis not present

## 2019-05-21 DIAGNOSIS — Z23 Encounter for immunization: Secondary | ICD-10-CM

## 2019-05-21 DIAGNOSIS — K580 Irritable bowel syndrome with diarrhea: Secondary | ICD-10-CM | POA: Diagnosis not present

## 2019-05-21 MED ORDER — HYOSCYAMINE SULFATE 0.125 MG SL SUBL: 0.1250 mg | SUBLINGUAL_TABLET | SUBLINGUAL | 11 refills | Status: DC | PRN

## 2019-05-21 NOTE — Addendum Note (Signed)
Addended by: Nanci Pina on: 05/21/2019 09:45 AM   Modules accepted: Orders

## 2019-05-21 NOTE — Progress Notes (Signed)
Subjective:    Patient ID: Jenna Leblanc, female    DOB: [birth year], 39 y.o.   MRN: [id number]  HPI Here for physical  This visit occurred during the SARS-CoV-2 public health emergency.  Safety protocols were in place, including screening questions prior to the visit, additional usage of staff PPE, and extensive cleaning of exam room while observing appropriate contact time as indicated for disinfecting solutions.   Still uses the levsin for IBS Doing well  Allergies worse this year More eye itching than in the past Using OTC pill prn zycam spray prn Recent cold symptoms---COVID was negative  Keeps up with gyn  Current Outpatient Medications on File Prior to Visit  Medication Sig Dispense Refill  . acyclovir (ZOVIRAX) 800 MG tablet Take 800 mg by mouth 2 (two) times daily as needed. For fever blister    . hyoscyamine (LEVSIN SL) 0.125 MG SL tablet PLACE 1 TABLET (0.125 MG TOTAL) UNDER THE TONGUE EVERY 4 (FOUR) HOURS AS NEEDED. 30 tablet 0  . meclizine (MEDI-MECLIZINE) 25 MG tablet Take 1 tablet (25 mg total) by mouth 3 (three) times daily as needed for dizziness. 60 tablet 1  . ondansetron (ZOFRAN) 4 MG tablet TAKE 1 TABLET BY MOUTH EVERY 8 HOURS AS NEEDED FOR NAUSEA AND VOMITING 20 tablet 0   No current facility-administered medications on file prior to visit.    No Known Allergies  Past Medical History:  Diagnosis Date  . Gestational diabetes    diet controlled  . IBS (irritable bowel syndrome)    Diarrhea type  . Kidney stones    none in 8 years    Past Surgical History:  Procedure Laterality Date  . LITHOTRIPSY    . VAGINAL DELIVERY      Family History  Problem Relation Age of Onset  . Cancer Sister        sarcoma in the uterus  . Cancer Maternal Grandmother        lung  . Diabetes Maternal Grandmother   . Heart attack Maternal Grandfather   . Pneumonia Paternal Grandmother   . Diabetes Paternal Grandmother   . Cancer Paternal Grandfather    ?spine    Social History   Socioeconomic History  . Marital status: Married    Spouse name: Not on file  . Number of children: 1  . Years of education: Not on file  . Highest education level: Not on file  Occupational History  . 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  Tobacco Use  . Smoking status: Former Smoker    Quit date: 07/15/2006    Years since quitting: 12.8  . Smokeless tobacco: Never Used  Substance and Sexual Activity  . Alcohol use: Yes    Alcohol/week: 0.0 standard drinks    Comment: 2-3 times per week  . Drug use: No  . Sexual activity: Yes    Partners: Male    Birth control/protection: None, Condom  Other Topics Concern  . Not on file  Social History Narrative  . Not on file   Social Determinants of Health   Financial Resource Strain:   . Difficulty of Paying Living Expenses: Not on file  Food Insecurity:   . Worried About Charity fundraiser in the Last Year: Not on file  . Ran Out of Food in the Last Year: Not on file  Transportation Needs:   . Lack of Transportation (Medical): Not on file  . Lack of Transportation (Non-Medical): Not on file  Physical Activity:   .  Days of Exercise per Week: Not on file  . Minutes of Exercise per Session: Not on file  Stress:   . Feeling of Stress : Not on file  Social Connections:   . Frequency of Communication with Friends and Family: Not on file  . Frequency of Social Gatherings with Friends and Family: Not on file  . Attends Religious Services: Not on file  . Active Member of Clubs or Organizations: Not on file  . Attends Banker Meetings: Not on file  . Marital Status: Not on file  Intimate Partner Violence:   . Fear of Current or Ex-Partner: Not on file  . Emotionally Abused: Not on file  . Physically Abused: Not on file  . Sexually Abused: Not on file   Review of Systems  Constitutional: Negative for fatigue and unexpected weight change.       Wears seat belt Works out with trainer  HENT:  Positive for hearing loss. Negative for dental problem and tinnitus.   Eyes: Negative for visual disturbance.  Respiratory: Negative for cough, chest tightness and shortness of breath.   Cardiovascular: Negative for chest pain, palpitations and leg swelling.  Gastrointestinal: Positive for diarrhea. Negative for blood in stool.  Endocrine: Negative for polydipsia and polyuria.  Genitourinary: Negative for dyspareunia.       Periods regular Trying to conceive  Musculoskeletal: Negative for arthralgias, back pain and joint swelling.  Skin: Negative for rash.  Allergic/Immunologic: Positive for environmental allergies. Negative for immunocompromised state.  Neurological: Negative for dizziness, syncope, light-headedness and headaches.  Hematological: Negative for adenopathy. Does not bruise/bleed easily.  Psychiatric/Behavioral: Negative for dysphoric mood and sleep disturbance.       Some anxiety with stress---not too bad (related to IBS)       Objective:   Physical Exam  Constitutional: She is oriented to person, place, and time. She appears well-developed. No distress.  HENT:  Head: Normocephalic and atraumatic.  Right Ear: External ear normal.  Left Ear: External ear normal.  Mouth/Throat: Oropharynx is clear and moist. No oropharyngeal exudate.  Eyes: Pupils are equal, round, and reactive to light. Conjunctivae are normal.  Neck: No thyromegaly present.  Cardiovascular: Normal rate, regular rhythm, normal heart sounds and intact distal pulses. Exam reveals no gallop.  No murmur heard. Respiratory: Effort normal and breath sounds normal. No respiratory distress. She has no wheezes. She has no rales.  GI: Soft. There is no abdominal tenderness.  Musculoskeletal:        General: No tenderness or edema.  Lymphadenopathy:    She has no cervical adenopathy.  Neurological: She is alert and oriented to person, place, and time.  Skin: No rash noted. No erythema.  Psychiatric: She has  a normal mood and affect. Her behavior is normal.           Assessment & Plan:

## 2019-05-21 NOTE — Assessment & Plan Note (Signed)
Uses the levsin prn

## 2019-05-21 NOTE — Assessment & Plan Note (Signed)
Healthy Stays fit Flu vaccine today COVID when available

## 2019-08-08 ENCOUNTER — Other Ambulatory Visit: Payer: Self-pay | Admitting: Internal Medicine

## 2019-08-15 ENCOUNTER — Encounter: Payer: Self-pay | Admitting: Internal Medicine

## 2019-08-21 ENCOUNTER — Encounter: Payer: Self-pay | Admitting: Obstetrics & Gynecology

## 2019-08-21 ENCOUNTER — Ambulatory Visit (INDEPENDENT_AMBULATORY_CARE_PROVIDER_SITE_OTHER): Payer: PRIVATE HEALTH INSURANCE | Admitting: Obstetrics & Gynecology

## 2019-08-21 ENCOUNTER — Other Ambulatory Visit: Payer: Self-pay

## 2019-08-21 ENCOUNTER — Other Ambulatory Visit (HOSPITAL_COMMUNITY)
Admission: RE | Admit: 2019-08-21 | Discharge: 2019-08-21 | Disposition: A | Discharge status: Home / Self Care | Payer: PRIVATE HEALTH INSURANCE | Source: Ambulatory Visit | Attending: Obstetrics & Gynecology | Admitting: Obstetrics & Gynecology

## 2019-08-21 VITALS — BP 99/64 | HR 61 | Wt 145.0 lb

## 2019-08-21 DIAGNOSIS — Z8632 Personal history of gestational diabetes: Secondary | ICD-10-CM

## 2019-08-21 DIAGNOSIS — O09299 Supervision of pregnancy with other poor reproductive or obstetric history, unspecified trimester: Secondary | ICD-10-CM

## 2019-08-21 DIAGNOSIS — Z3A08 8 weeks gestation of pregnancy: Secondary | ICD-10-CM | POA: Diagnosis not present

## 2019-08-21 DIAGNOSIS — O0991 Supervision of high risk pregnancy, unspecified, first trimester: Secondary | ICD-10-CM | POA: Diagnosis not present

## 2019-08-21 DIAGNOSIS — O09529 Supervision of elderly multigravida, unspecified trimester: Secondary | ICD-10-CM | POA: Insufficient documentation

## 2019-08-21 DIAGNOSIS — O09521 Supervision of elderly multigravida, first trimester: Secondary | ICD-10-CM

## 2019-08-21 DIAGNOSIS — O0990 Supervision of high risk pregnancy, unspecified, unspecified trimester: Secondary | ICD-10-CM | POA: Diagnosis present

## 2019-08-21 DIAGNOSIS — K589 Irritable bowel syndrome without diarrhea: Secondary | ICD-10-CM

## 2019-08-21 DIAGNOSIS — O09523 Supervision of elderly multigravida, third trimester: Secondary | ICD-10-CM | POA: Insufficient documentation

## 2019-08-21 HISTORY — DX: Supervision of pregnancy with other poor reproductive or obstetric history, unspecified trimester: O09.299

## 2019-08-21 HISTORY — DX: Personal history of gestational diabetes: Z86.32

## 2019-08-21 MED ORDER — ASPIRIN EC 81 MG PO TBEC: 81.0000 mg | DELAYED_RELEASE_TABLET | Freq: Every day | ORAL | 2 refills | Status: DC

## 2019-08-21 NOTE — Progress Notes (Signed)
History:   Jenna Leblanc is a 39 y.o. G3P1011 at [redacted]w[redacted]d by early office ultrasound in office today (unsure LMP) being seen today for her first obstetrical visit.  Her obstetrical history is significant for term pregnancy complicated by A1GDM, had a vaginal delivery. Patient does intend to breast feed. Pregnancy history fully reviewed.  Patient reports no complaints. On a plant-based diet now, taking B12 and folate supplements.      HISTORY: OB History  Gravida Para Term Preterm AB Living  3 1 1  0 1 1  SAB TAB Ectopic Multiple Live Births  1 0 0 0 1    # Outcome Date GA Lbr Len/2nd Weight Sex Delivery Anes PTL Lv  3 Current           2 Term 03/15/14 [redacted]w[redacted]d 06:51 / 04:02 5 lb 13.8 oz (2.66 kg) F Vag-Spont EPI  LIV     Apgar1: 8  Apgar5: 9  1 SAB 2013 [redacted]w[redacted]d         Last pap smear was done 05/2018 and was normal  Past Medical History:  Diagnosis Date  . Family history of hypertrophic cardiomyopathy 06/07/2016  . Gestational diabetes    diet controlled  . IBS (irritable bowel syndrome)    Diarrhea type  . Kidney stones    none in 8 years  . Oral herpes simplex infection    Never had genital   Past Surgical History:  Procedure Laterality Date  . LITHOTRIPSY    . VAGINAL DELIVERY     Family History  Problem Relation Age of Onset  . Cancer Sister        sarcoma in the uterus  . Cancer Maternal Grandmother        lung  . Diabetes Maternal Grandmother   . Heart attack Maternal Grandfather   . Pneumonia Paternal Grandmother   . Diabetes Paternal Grandmother   . Cancer Paternal Grandfather        ?spine   Social History   Tobacco Use  . Smoking status: Former Smoker    Quit date: 07/15/2006    Years since quitting: 13.1  . Smokeless tobacco: Never Used  Substance Use Topics  . Alcohol use: Yes    Alcohol/week: 0.0 standard drinks    Comment: 2-3 times per week  . Drug use: No   No Known Allergies Current Outpatient Medications on File Prior to Visit  Medication Sig  Dispense Refill  . acyclovir (ZOVIRAX) 800 MG tablet Take 800 mg by mouth 2 (two) times daily as needed. For fever blister    . ondansetron (ZOFRAN) 4 MG tablet TAKE 1 TABLET BY MOUTH EVERY 8 HOURS AS NEEDED FOR NAUSEA AND VOMITING 20 tablet 2  . Prenatal Vit-Fe Fumarate-FA (MULTIVITAMIN-PRENATAL) 27-0.8 MG TABS tablet Take 1 tablet by mouth daily at 12 noon.    . vitamin B-12 (CYANOCOBALAMIN) 500 MCG tablet Take 500 mcg by mouth daily.     No current facility-administered medications on file prior to visit.    Review of Systems Pertinent items noted in HPI and remainder of comprehensive ROS otherwise negative. Physical Exam:   Vitals:   08/21/19 1052  BP: 99/64  Pulse: 61  Weight: 145 lb (65.8 kg)   Bedside Ultrasound for FHR check:Fetal Heart Rate (bpm): 173  Patient informed that the ultrasound is considered a limited obstetric ultrasound and is not intended to be a complete ultrasound exam.  Patient also informed that the ultrasound is not being completed with the intent  of assessing for fetal or placental anomalies or any pelvic abnormalities.  Explained that the purpose of today's ultrasound is to assess for fetal heart rate.  Patient acknowledges the purpose of the exam and the limitations of the study General: well-developed, well-nourished female in no acute distress  Breasts:  deferred  Skin: normal coloration and turgor, no rashes  Neurologic: oriented, normal, negative, normal mood  Extremities: normal strength, tone, and muscle mass, ROM of all joints is normal  HEENT PERRLA, extraocular movement intact and sclera clear, anicteric  Mouth/Teeth mucous membranes moist, pharynx normal without lesions and dental hygiene good  Neck supple and no masses  Cardiovascular: regular rate and rhythm  Respiratory:  no respiratory distress, normal breath sounds  Abdomen: soft, non-tender; bowel sounds normal; no masses,  no organomegaly  Pelvic: deferred    Assessment:      Pregnancy: [id number] Patient Active Problem List   Diagnosis Date Noted  . H/O gestational diabetes in prior pregnancy, currently pregnant 08/21/2019  . AMA (advanced maternal age) multigravida 35+ 08/21/2019  . Supervision of high risk pregnancy, antepartum 08/21/2019  . IBS (irritable bowel syndrome) 04/27/2015     Plan:    1. H/O gestational diabetes in prior pregnancy, currently pregnant Surveillance labs ordered. ASA recommended later in pregnancy.  - Comprehensive metabolic panel - Hemoglobin A1c - TSH - Protein / creatinine ratio, urine - aspirin EC 81 MG tablet; Take 1 tablet (81 mg total) by mouth daily. Take after 12 weeks for prevention of preeclampsia later in pregnancy  Dispense: 300 tablet; Refill: 2  2. Irritable bowel syndrome, unspecified type No acute issues  3. Multigravida of advanced maternal age in first trimester Will get NIPS next visit.  - [place] MFM OB DETAIL +14 WK; Future  4. Supervision of high risk pregnancy, antepartum - Obstetric Panel, Including HIV - Culture, OB Urine - GC/Chlamydia probe amp ([place])not at [facility] - Hepatitis c antibody (reflex) - [place] MFM OB DETAIL +14 WK; Future - Babyscripts Schedule Optimization Initial labs drawn. Continue prenatal vitamins. Genetic Screening discussed, NIPS: to be ordered next visit. Ultrasound discussed; fetal anatomic survey: ordered. Problem list reviewed and updated. The nature of [facility] with multiple MDs and other Advanced Practice Providers was explained to patient; also emphasized that residents, students are part of our team. Routine obstetric precautions reviewed. Return in about 4 weeks (around 09/18/2019) for OFFICE OB Visit and Panor[facility].     Verita Schneiders, MD, [place] for [employer] [facility] [employer], [place]

## 2019-08-21 NOTE — Progress Notes (Signed)
DATING AND VIABILITY SONOGRAM   Jenna Leblanc is a 39 y.o. year old G80P1011 with LMP Patient's last menstrual period was 06/13/2019 (approximate). which would correlate to  [redacted]w[redacted]d weeks gestation.  She has irregular menstrual cycles.   She is here today for a confirmatory initial sonogram.    GESTATION: SINGLETON yes     FETAL ACTIVITY:          Heart rate   173          The fetus is current.   GESTATIONAL AGE AND  BIOMETRICS:  Gestational criteria: Estimated Date of Delivery: 03/28/20 by early ultrasound now at [redacted]w[redacted]d  Previous Scans:0  GESTATIONAL SAC           3.60 mm         8.5 weeks  CROWN RUMP LENGTH           2.02 mm         8.4 weeks                                                   AVERAGE EGA(BY THIS SCAN):  8.4 weeks  WORKING EDD( early ultrasound ):  03/29/2011     TECHNICIAN COMMENTS:  Patient informed that the ultrasound is considered a limited obstetric ultrasound and is not intended to be a complete ultrasound exam. Patient also informed that the ultrasound is not being completed with the intent of assessing for fetal or placental anomalies or any pelvic abnormalities. Explained that the purpose of today's ultrasound is to assess for fetal heart rate. Patient acknowledges the purpose of the exam and the limitations of the study.        A copy of this report including all images has been saved and backed up to a second source for retrieval if needed. All measures and details of the anatomical scan, placentation, fluid volume and pelvic anatomy are contained in that report.  Scheryl Marten 08/21/2019 11:37 AM

## 2019-08-21 NOTE — Patient Instructions (Signed)
First Trimester of Pregnancy The first trimester of pregnancy is from week 1 until the end of week 13 (months 1 through 3). A week after a sperm fertilizes an egg, the egg will implant on the wall of the uterus. This embryo will begin to develop into a baby. Genes from you and your partner will form the baby. The female genes will determine whether the baby will be a boy or a girl. At 6-8 weeks, the eyes and face will be formed, and the heartbeat can be seen on ultrasound. At the end of 12 weeks, all the baby's organs will be formed. Now that you are pregnant, you will want to do everything you can to have a healthy baby. Two of the most important things are to get good prenatal care and to follow your health care provider's instructions. Prenatal care is all the medical care you receive before the baby's birth. This care will help prevent, find, and treat any problems during the pregnancy and childbirth. Body changes during your first trimester Your body goes through many changes during pregnancy. The changes vary from woman to woman.  You may gain or lose a couple of pounds at first.  You may feel sick to your stomach (nauseous) and you may throw up (vomit). If the vomiting is uncontrollable, call your health care provider.  You may tire easily.  You may develop headaches that can be relieved by medicines. All medicines should be approved by your health care provider.  You may urinate more often. Painful urination may mean you have a bladder infection.  You may develop heartburn as a result of your pregnancy.  You may develop constipation because certain hormones are causing the muscles that push stool through your intestines to slow down.  You may develop hemorrhoids or swollen veins (varicose veins).  Your breasts may begin to grow larger and become tender. Your nipples may stick out more, and the tissue that surrounds them (areola) may become darker.  Your gums may bleed and may be  sensitive to brushing and flossing.  Dark spots or blotches (chloasma, mask of pregnancy) may develop on your face. This will likely fade after the baby is born.  Your menstrual periods will stop.  You may have a loss of appetite.  You may develop cravings for certain kinds of food.  You may have changes in your emotions from day to day, such as being excited to be pregnant or being concerned that something may go wrong with the pregnancy and baby.  You may have more vivid and strange dreams.  You may have changes in your hair. These can include thickening of your hair, rapid growth, and changes in texture. Some women also have hair loss during or after pregnancy, or hair that feels dry or thin. Your hair will most likely return to normal after your baby is born. What to expect at prenatal visits During a routine prenatal visit:  You will be weighed to make sure you and the baby are growing normally.  Your blood pressure will be taken.  Your abdomen will be measured to track your baby's growth.  The fetal heartbeat will be listened to between weeks 10 and 14 of your pregnancy.  Test results from any previous visits will be discussed. Your health care provider may ask you:  How you are feeling.  If you are feeling the baby move.  If you have had any abnormal symptoms, such as leaking fluid, bleeding, severe headaches, or abdominal   cramping.  If you are using any tobacco products, including cigarettes, chewing tobacco, and electronic cigarettes.  If you have any questions. Other tests that may be performed during your first trimester include:  Blood tests to find your blood type and to check for the presence of any previous infections. The tests will also be used to check for low iron levels (anemia) and protein on red blood cells (Rh antibodies). Depending on your risk factors, or if you previously had diabetes during pregnancy, you may have tests to check for high blood sugar  that affects pregnant women (gestational diabetes).  Urine tests to check for infections, diabetes, or protein in the urine.  An ultrasound to confirm the proper growth and development of the baby.  Fetal screens for spinal cord problems (spina bifida) and Down syndrome.  HIV (human immunodeficiency virus) testing. Routine prenatal testing includes screening for HIV, unless you choose not to have this test.  You may need other tests to make sure you and the baby are doing well. Follow these instructions at home: Medicines  Follow your health care provider's instructions regarding medicine use. Specific medicines may be either safe or unsafe to take during pregnancy.  Take a prenatal vitamin that contains at least 600 micrograms (mcg) of folic acid.  If you develop constipation, try taking a stool softener if your health care provider approves. Eating and drinking   Eat a balanced diet that includes fresh fruits and vegetables, whole grains, good sources of protein such as meat, eggs, or tofu, and low-fat dairy. Your health care provider will help you determine the amount of weight gain that is right for you.  Avoid raw meat and uncooked cheese. These carry germs that can cause birth defects in the baby.  Eating four or five small meals rather than three large meals a day may help relieve nausea and vomiting. If you start to feel nauseous, eating a few soda crackers can be helpful. Drinking liquids between meals, instead of during meals, also seems to help ease nausea and vomiting.  Limit foods that are high in fat and processed sugars, such as fried and sweet foods.  To prevent constipation: ? Eat foods that are high in fiber, such as fresh fruits and vegetables, whole grains, and beans. ? Drink enough fluid to keep your urine clear or pale yellow. Activity  Exercise only as directed by your health care provider. Most women can continue their usual exercise routine during  pregnancy. Try to exercise for 30 minutes at least 5 days a week. Exercising will help you: ? Control your weight. ? Stay in shape. ? Be prepared for labor and delivery.  Experiencing pain or cramping in the lower abdomen or lower back is a good sign that you should stop exercising. Check with your health care provider before continuing with normal exercises.  Try to avoid standing for long periods of time. Move your legs often if you must stand in one place for a long time.  Avoid heavy lifting.  Wear low-heeled shoes and practice good posture.  You may continue to have sex unless your health care provider tells you not to. Relieving pain and discomfort  Wear a good support bra to relieve breast tenderness.  Take warm sitz baths to soothe any pain or discomfort caused by hemorrhoids. Use hemorrhoid cream if your health care provider approves.  Rest with your legs elevated if you have leg cramps or low back pain.  If you develop varicose veins in   your legs, wear support hose. Elevate your feet for 15 minutes, 3-4 times a day. Limit salt in your diet. Prenatal care  Schedule your prenatal visits by the twelfth week of pregnancy. They are usually scheduled monthly at first, then more often in the last 2 months before delivery.  Write down your questions. Take them to your prenatal visits.  Keep all your prenatal visits as told by your health care provider. This is important. Safety  Wear your seat belt at all times when driving.  Make a list of emergency phone numbers, including numbers for family, friends, the hospital, and police and fire departments. General instructions  Ask your health care provider for a referral to a local prenatal education class. Begin classes no later than the beginning of month 6 of your pregnancy.  Ask for help if you have counseling or nutritional needs during pregnancy. Your health care provider can offer advice or refer you to specialists for help  with various needs.  Do not use hot tubs, steam rooms, or saunas.  Do not douche or use tampons or scented sanitary pads.  Do not cross your legs for long periods of time.  Avoid cat litter boxes and soil used by cats. These carry germs that can cause birth defects in the baby and possibly loss of the fetus by miscarriage or stillbirth.  Avoid all smoking, herbs, alcohol, and medicines not prescribed by your health care provider. Chemicals in these products affect the formation and growth of the baby.  Do not use any products that contain nicotine or tobacco, such as cigarettes and e-cigarettes. If you need help quitting, ask your health care provider. You may receive counseling support and other resources to help you quit.  Schedule a dentist appointment. At home, brush your teeth with a soft toothbrush and be gentle when you floss. Contact a health care provider if:  You have dizziness.  You have mild pelvic cramps, pelvic pressure, or nagging pain in the abdominal area.  You have persistent nausea, vomiting, or diarrhea.  You have a bad smelling vaginal discharge.  You have pain when you urinate.  You notice increased swelling in your face, hands, legs, or ankles.  You are exposed to fifth disease or chickenpox.  You are exposed to German measles (rubella) and have never had it. Get help right away if:  You have a fever.  You are leaking fluid from your vagina.  You have spotting or bleeding from your vagina.  You have severe abdominal cramping or pain.  You have rapid weight gain or loss.  You vomit blood or material that looks like coffee grounds.  You develop a severe headache.  You have shortness of breath.  You have any kind of trauma, such as from a fall or a car accident. Summary  The first trimester of pregnancy is from week 1 until the end of week 13 (months 1 through 3).  Your body goes through many changes during pregnancy. The changes vary from  woman to woman.  You will have routine prenatal visits. During those visits, your health care provider will examine you, discuss any test results you may have, and talk with you about how you are feeling. This information is not intended to replace advice given to you by your health care provider. Make sure you discuss any questions you have with your health care provider. Document Revised: 04/06/2017 Document Reviewed: 04/05/2016 Elsevier Patient Education  2020 Elsevier Inc.  

## 2019-08-22 LAB — OBSTETRIC PANEL, INCLUDING HIV
Antibody Screen: NEGATIVE
Basophils Absolute: 0.1 10*3/uL (ref 0.0–0.2)
Basos: 1 %
EOS (ABSOLUTE): 0.1 10*3/uL (ref 0.0–0.4)
Eos: 1 %
HIV Screen 4th Generation wRfx: NONREACTIVE
Hematocrit: 41.8 % (ref 34.0–46.6)
Hemoglobin: 14.1 g/dL (ref 11.1–15.9)
Hepatitis B Surface Ag: NEGATIVE
Immature Grans (Abs): 0 10*3/uL (ref 0.0–0.1)
Immature Granulocytes: 0 %
Lymphocytes Absolute: 2.3 10*3/uL (ref 0.7–3.1)
Lymphs: 23 %
MCH: 30.3 pg (ref 26.6–33.0)
MCHC: 33.7 g/dL (ref 31.5–35.7)
MCV: 90 fL (ref 79–97)
Monocytes Absolute: 0.8 10*3/uL (ref 0.1–0.9)
Monocytes: 8 %
Neutrophils Absolute: 6.8 10*3/uL (ref 1.4–7.0)
Neutrophils: 67 %
Platelets: 265 10*3/uL (ref 150–450)
RBC: 4.66 x10E6/uL (ref 3.77–5.28)
RDW: 12.3 % (ref 11.7–15.4)
RPR Ser Ql: NONREACTIVE
Rh Factor: POSITIVE
Rubella Antibodies, IGG: 1.33 index (ref >0.99)
WBC: 10 10*3/uL (ref 3.4–10.8)

## 2019-08-22 LAB — COMPREHENSIVE METABOLIC PANEL
ALT: 16 IU/L (ref 0–32)
AST: 15 IU/L (ref 0–40)
Albumin/Globulin Ratio: 1.6 (ref 1.2–2.2)
Albumin: 4.8 g/dL (ref 3.8–4.8)
Alkaline Phosphatase: 49 IU/L (ref 39–117)
BUN/Creatinine Ratio: 12 (ref 9–23)
BUN: 8 mg/dL (ref 6–20)
Bilirubin Total: 0.4 mg/dL (ref 0.0–1.2)
CO2: 20 mmol/L (ref 20–29)
Calcium: 10 mg/dL (ref 8.7–10.2)
Chloride: 100 mmol/L (ref 96–106)
Creatinine, Ser: 0.65 mg/dL (ref 0.57–1.00)
GFR calc Af Amer: 129 mL/min/{1.73_m2} (ref >59)
GFR calc non Af Amer: 112 mL/min/{1.73_m2} (ref >59)
Globulin, Total: 3 g/dL (ref 1.5–4.5)
Glucose: 83 mg/dL (ref 65–99)
Potassium: 4.6 mmol/L (ref 3.5–5.2)
Sodium: 136 mmol/L (ref 134–144)
Total Protein: 7.8 g/dL (ref 6.0–8.5)

## 2019-08-22 LAB — GC/CHLAMYDIA PROBE AMP (~~LOC~~) NOT AT ARMC
Chlamydia: NEGATIVE
Comment: NEGATIVE
Comment: NORMAL
Neisseria Gonorrhea: NEGATIVE

## 2019-08-22 LAB — PROTEIN / CREATININE RATIO, URINE
Creatinine, Urine: 68.9 mg/dL
Protein, Ur: 5.3 mg/dL
Protein/Creat Ratio: 77 mg/g creat (ref 0–200)

## 2019-08-22 LAB — HEPATITIS C ANTIBODY (REFLEX): HCV Ab: <0.1 s/co ratio (ref 0.0–0.9)

## 2019-08-22 LAB — HCV COMMENT:

## 2019-08-22 LAB — HEMOGLOBIN A1C
Est. average glucose Bld gHb Est-mCnc: 97 mg/dL
Hgb A1c MFr Bld: 5 % (ref 4.8–5.6)

## 2019-08-22 LAB — TSH: TSH: 1.98 u[IU]/mL (ref 0.450–4.500)

## 2019-08-23 LAB — CULTURE, OB URINE

## 2019-08-23 LAB — URINE CULTURE, OB REFLEX

## 2019-08-28 ENCOUNTER — Encounter: Payer: PRIVATE HEALTH INSURANCE | Admitting: Obstetrics & Gynecology

## 2019-09-18 ENCOUNTER — Ambulatory Visit (INDEPENDENT_AMBULATORY_CARE_PROVIDER_SITE_OTHER): Payer: PRIVATE HEALTH INSURANCE | Admitting: Obstetrics and Gynecology

## 2019-09-18 ENCOUNTER — Encounter: Payer: Self-pay | Admitting: Obstetrics and Gynecology

## 2019-09-18 ENCOUNTER — Other Ambulatory Visit: Payer: Self-pay

## 2019-09-18 VITALS — BP 114/73 | HR 77 | Wt 144.0 lb

## 2019-09-18 DIAGNOSIS — O09291 Supervision of pregnancy with other poor reproductive or obstetric history, first trimester: Secondary | ICD-10-CM

## 2019-09-18 DIAGNOSIS — Z3A12 12 weeks gestation of pregnancy: Secondary | ICD-10-CM

## 2019-09-18 DIAGNOSIS — Z8632 Personal history of gestational diabetes: Secondary | ICD-10-CM

## 2019-09-18 DIAGNOSIS — O0990 Supervision of high risk pregnancy, unspecified, unspecified trimester: Secondary | ICD-10-CM

## 2019-09-18 NOTE — Progress Notes (Signed)
Prenatal Visit Note Date: 09/18/2019 Clinic: [facility]  Subjective:  Jenna Leblanc is a 39 y.o. G3P1011 at [redacted]w[redacted]d being seen today for ongoing prenatal care.  She is currently monitored for the following issues for this low-risk pregnancy and has IBS (irritable bowel syndrome); AMA (advanced maternal age) multigravida 35+; and Supervision of high risk pregnancy, antepartum on their problem list.  Patient reports no complaints.   Contractions: Not present. Vag. Bleeding: None.   . Denies leaking of fluid.   The following portions of the patient's history were reviewed and updated as appropriate: allergies, current medications, past family history, past medical history, past social history, past surgical history and problem list. Problem list updated.  Objective:   Vitals:   09/18/19 1032  BP: 114/73  Pulse: 77  Weight: 144 lb (65.3 kg)    Fetal Status: Fetal Heart Rate (bpm): 159         General:  Alert, oriented and cooperative. Patient is in no acute distress.  Skin: Skin is warm and dry. No rash noted.   Cardiovascular: Normal heart rate noted  Respiratory: Normal respiratory effort, no problems with respiration noted  Abdomen: Soft, gravid, appropriate for gestational age. Pain/Pressure: Absent     Pelvic:  Cervical exam deferred        Extremities: Normal range of motion.     Mental Status: Normal mood and affect. Normal behavior. Normal judgment and thought content.   Urinalysis:      Assessment and Plan:  Pregnancy: G3P1011 at [redacted]w[redacted]d  1. Supervision of high risk pregnancy, antepartum Routine care. Genetics today. Anatomy u/s already scheduled. Offer afp nv - VITAMIN D 25 Hydroxy (Vit-D Deficiency, Fractures)  Preterm labor symptoms and general obstetric precautions including but not limited to vaginal bleeding, contractions, leaking of fluid and fetal movement were reviewed in detail with the patient. Please refer to After Visit Summary for other  counseling recommendations.  Return in about 3 weeks (around 10/09/2019) for in person, low risk.   Helix Bing, MD

## 2019-09-19 LAB — VITAMIN D 25 HYDROXY (VIT D DEFICIENCY, FRACTURES): Vit D, 25-Hydroxy: 30.4 ng/mL (ref 30.0–100.0)

## 2019-09-22 ENCOUNTER — Other Ambulatory Visit: Payer: Self-pay | Admitting: Obstetrics and Gynecology

## 2019-09-22 MED ORDER — VITAMIN D 50 MCG (2000 UT) PO TABS: 2000.0000 [IU] | ORAL_TABLET | Freq: Every day | ORAL | 1 refills | Status: AC

## 2019-09-25 ENCOUNTER — Encounter: Payer: Self-pay | Admitting: Radiology

## 2019-09-25 ENCOUNTER — Telehealth: Payer: Self-pay | Admitting: Radiology

## 2019-09-25 NOTE — Telephone Encounter (Signed)
Called to give patient results for [facility] genetic screening, was unable to leave voicemail, inbox full. Sent patient a message on mychart to contact the office for results. Lab results scanned.

## 2019-10-09 ENCOUNTER — Ambulatory Visit (INDEPENDENT_AMBULATORY_CARE_PROVIDER_SITE_OTHER): Payer: PRIVATE HEALTH INSURANCE | Admitting: Obstetrics and Gynecology

## 2019-10-09 ENCOUNTER — Other Ambulatory Visit: Payer: Self-pay

## 2019-10-09 VITALS — BP 93/60 | HR 65 | Wt 148.0 lb

## 2019-10-09 DIAGNOSIS — O09522 Supervision of elderly multigravida, second trimester: Secondary | ICD-10-CM

## 2019-10-09 DIAGNOSIS — O0992 Supervision of high risk pregnancy, unspecified, second trimester: Secondary | ICD-10-CM

## 2019-10-09 DIAGNOSIS — O0990 Supervision of high risk pregnancy, unspecified, unspecified trimester: Secondary | ICD-10-CM

## 2019-10-09 DIAGNOSIS — Z3A15 15 weeks gestation of pregnancy: Secondary | ICD-10-CM

## 2019-10-09 MED ORDER — ACYCLOVIR 400 MG PO TABS: ORAL_TABLET | ORAL | 3 refills | Status: DC

## 2019-10-09 NOTE — Progress Notes (Signed)
Prenatal Visit Note Date: 10/09/2019 Clinic: [facility]  Subjective:  Jenna Leblanc is a 39 y.o. G3P1011 at [redacted]w[redacted]d being seen today for ongoing prenatal care.  She is currently monitored for the following issues for this low-risk pregnancy and has IBS (irritable bowel syndrome); AMA (advanced maternal age) multigravida 35+; and Supervision of high risk pregnancy, antepartum on their problem list.  Patient reports fever bilsters.   Contractions: Not present. Vag. Bleeding: None.   . Denies leaking of fluid.   The following portions of the patient's history were reviewed and updated as appropriate: allergies, current medications, past family history, past medical history, past social history, past surgical history and problem list. Problem list updated.  Objective:   Vitals:   10/09/19 1003  BP: 93/60  Pulse: 65  Weight: 148 lb (67.1 kg)    Fetal Status: Fetal Heart Rate (bpm): 142         General:  Alert, oriented and cooperative. Patient is in no acute distress.  Skin: Skin is warm and dry. No rash noted.   Cardiovascular: Normal heart rate noted  Respiratory: Normal respiratory effort, no problems with respiration noted  Abdomen: Soft, gravid, appropriate for gestational age. Pain/Pressure: Absent     Pelvic:  Cervical exam deferred        Extremities: Normal range of motion.  Edema: None  Mental Status: Normal mood and affect. Normal behavior. Normal judgment and thought content.   Urinalysis:      Assessment and Plan:  Pregnancy: G3P1011 at [redacted]w[redacted]d  1. Supervision of high risk pregnancy, antepartum Routine care. Anatomy u/s already scheduled. Acyclovir suppression dosing given.  - AFP, Serum, Open Spina Bifida  2. Multigravida of advanced maternal age in second trimester - AFP, Serum, Open Spina Bifida  3. [redacted] weeks gestation of pregnancy  Preterm labor symptoms and general obstetric precautions including but not limited to vaginal bleeding,  contractions, leaking of fluid and fetal movement were reviewed in detail with the patient. Please refer to After Visit Summary for other counseling recommendations.  Return in about 1 month (around 11/08/2019) for low risk, in person, virtual visit.   [place] Bing, MD

## 2019-10-09 NOTE — Progress Notes (Signed)
Getting more fever blisters since being pregnant

## 2019-10-11 LAB — AFP, SERUM, OPEN SPINA BIFIDA
AFP MoM: 1.45
AFP Value: 46.3 ng/mL
Gest. Age on Collection Date: 15.4 weeks
Maternal Age At EDD: 39.8 yr
OSBR Risk 1 IN: 3133
Test Results:: NEGATIVE
Weight: 148 [lb_av]

## 2019-11-03 ENCOUNTER — Ambulatory Visit (HOSPITAL_COMMUNITY): Payer: PRIVATE HEALTH INSURANCE | Attending: Obstetrics and Gynecology

## 2019-11-03 ENCOUNTER — Ambulatory Visit: Payer: PRIVATE HEALTH INSURANCE | Admitting: *Deleted

## 2019-11-03 ENCOUNTER — Other Ambulatory Visit: Payer: Self-pay

## 2019-11-03 VITALS — BP 103/58 | HR 71

## 2019-11-03 DIAGNOSIS — Z3A19 19 weeks gestation of pregnancy: Secondary | ICD-10-CM

## 2019-11-03 DIAGNOSIS — O09521 Supervision of elderly multigravida, first trimester: Secondary | ICD-10-CM | POA: Insufficient documentation

## 2019-11-03 DIAGNOSIS — O09522 Supervision of elderly multigravida, second trimester: Secondary | ICD-10-CM | POA: Diagnosis present

## 2019-11-03 DIAGNOSIS — O09292 Supervision of pregnancy with other poor reproductive or obstetric history, second trimester: Secondary | ICD-10-CM

## 2019-11-03 DIAGNOSIS — Z363 Encounter for antenatal screening for malformations: Secondary | ICD-10-CM | POA: Diagnosis not present

## 2019-11-03 DIAGNOSIS — O0990 Supervision of high risk pregnancy, unspecified, unspecified trimester: Secondary | ICD-10-CM | POA: Diagnosis present

## 2019-11-06 ENCOUNTER — Other Ambulatory Visit: Payer: Self-pay

## 2019-11-06 ENCOUNTER — Telehealth (INDEPENDENT_AMBULATORY_CARE_PROVIDER_SITE_OTHER): Payer: PRIVATE HEALTH INSURANCE | Admitting: Obstetrics & Gynecology

## 2019-11-06 ENCOUNTER — Encounter: Payer: Self-pay | Admitting: Obstetrics & Gynecology

## 2019-11-06 DIAGNOSIS — Z3A19 19 weeks gestation of pregnancy: Secondary | ICD-10-CM

## 2019-11-06 DIAGNOSIS — O0992 Supervision of high risk pregnancy, unspecified, second trimester: Secondary | ICD-10-CM

## 2019-11-06 DIAGNOSIS — O0990 Supervision of high risk pregnancy, unspecified, unspecified trimester: Secondary | ICD-10-CM

## 2019-11-06 DIAGNOSIS — O09292 Supervision of pregnancy with other poor reproductive or obstetric history, second trimester: Secondary | ICD-10-CM

## 2019-11-06 DIAGNOSIS — Z8632 Personal history of gestational diabetes: Secondary | ICD-10-CM

## 2019-11-06 DIAGNOSIS — O09299 Supervision of pregnancy with other poor reproductive or obstetric history, unspecified trimester: Secondary | ICD-10-CM

## 2019-11-06 DIAGNOSIS — O09522 Supervision of elderly multigravida, second trimester: Secondary | ICD-10-CM

## 2019-11-06 NOTE — Patient Instructions (Signed)
Return to office for any scheduled appointments. Call the office or go to the [facility] at [facility] if:  You begin to have strong, frequent contractions  Your water breaks.  Sometimes it is a big gush of fluid, sometimes it is just a trickle that keeps getting your panties wet or running down your legs  You have vaginal bleeding.  It is normal to have a small amount of spotting if your cervix was checked.   You do not feel your baby moving like normal.  If you do not, get something to eat and drink and lay down and focus on feeling your baby move.   If your baby is still not moving like normal, you should call the office or go to MAU.  Any other obstetric concerns.   

## 2019-11-06 NOTE — Progress Notes (Signed)
OBSTETRICS PRENATAL VIRTUAL VISIT ENCOUNTER NOTE  Provider location: [facility] for [facility] at [facility]   I connected with Jenna Leblanc on 11/06/19 at  8:30 AM EDT by MyChart Video Encounter at home and verified that I am speaking with the correct person using two identifiers.   I discussed the limitations, risks, security and privacy concerns of performing an evaluation and management service virtually and the availability of in person appointments. I also discussed with the patient that there may be a patient responsible charge related to this service. The patient expressed understanding and agreed to proceed. Subjective:  Jenna Leblanc is a 39 y.o. G3P1011 at [redacted]w[redacted]d being seen today for ongoing prenatal care.  She is currently monitored for the following issues for this high-risk pregnancy and has IBS (irritable bowel syndrome); AMA (advanced maternal age) multigravida 35+; and Supervision of high risk pregnancy, antepartum on their problem list.  Patient reports no complaints.  Contractions: Not present. Vag. Bleeding: None.  Movement: Present. Denies any leaking of fluid.   The following portions of the patient's history were reviewed and updated as appropriate: allergies, current medications, past family history, past medical history, past social history, past surgical history and problem list.   Objective:  There were no vitals filed for this visit.  Fetal Status:     Movement: Present     General:  Alert, oriented and cooperative. Patient is in no acute distress.  Respiratory: Normal respiratory effort, no problems with respiration noted  Mental Status: Normal mood and affect. Normal behavior. Normal judgment and thought content.  Rest of physical exam deferred due to type of encounter  Imaging: [place] MFM OB DETAIL +14 WK  Result Date: 11/03/2019 ----------------------------------------------------------------------  OBSTETRICS REPORT                       (Signed  Final 11/03/2019 12:02 pm) ---------------------------------------------------------------------- Patient Info  ID #:       [id number]                          D.O.B.:  Apr 12, 1981 (39 yrs)  Name:       Jenna Leblanc                Visit Date: 11/03/2019 10:25 am ---------------------------------------------------------------------- Performed By  Attending:        Ma Rings MD         Ref. Address:     [address]                                                             [address]  Performed By:     Eden Lathe BS      [facility]                                 [facility]  Referred By:      [facility] ---------------------------------------------------------------------- Orders  #  Description  Code        Ordered By  1  [place] MFM OB DETAIL +14 WK               [id number]    Jaynie Collins ----------------------------------------------------------------------  #  Order #                     Accession #                Episode #  1  [id number]                   [id number]                 [id number] ---------------------------------------------------------------------- Indications  Advanced maternal age multigravida 92+,        O9.522  second trimester (low risk NIPS, neg AFP)  [redacted] weeks gestation of pregnancy                Z3A.19  Antenatal screening for malformations          Z36.3  Poor obstetric history: Previous gestational   O09.299  diabetes ---------------------------------------------------------------------- Fetal Evaluation  Num Of Fetuses:         1  Fetal Heart Rate(bpm):  140  Cardiac Activity:       Observed  Presentation:           Cephalic  Placenta:               Posterior  P. Cord Insertion:      Not well visualized  Amniotic Fluid  AFI FV:      Within normal limits                              Largest Pocket(cm)                              3.5 ----------------------------------------------------------------------  Biometry  BPD:      42.6  mm     G. Age:  18w 6d         39  %    CI:        67.48   %    70 - 86                                                          FL/HC:      17.8   %    16.1 - 18.3  HC:       166   mm     G. Age:  19w 2d         50  %    HC/AC:      1.16        1.09 - 1.39  AC:      142.8  mm     G. Age:  19w 4d         62  %    FL/BPD:     69.5   %  FL:       29.6  mm     G. Age:  19w 1d         42  %  FL/AC:      20.7   %    20 - 24  HUM:      28.4  mm     G. Age:  19w 1d         51  %  CER:      19.6  mm     G. Age:  19w 0d         37  %  NFT:       4.6  mm  CM:        4.4  mm  Est. FW:     289  gm    0 lb 10 oz      60  % ---------------------------------------------------------------------- OB History  Gravidity:    3         Term:   1        Prem:   0        SAB:   1  TOP:          0       Ectopic:  0        Living: 1 ---------------------------------------------------------------------- Gestational Age  LMP:           20w 3d        Date:  06/13/19                 EDD:   03/19/20  U/S Today:     19w 2d                                        EDD:   03/27/20  Best:          19w 1d     Det. By:  Marcella Dubs         EDD:   03/28/20                                      (08/21/19) ---------------------------------------------------------------------- Anatomy  Cranium:               Appears normal         Aortic Arch:            Appears normal  Cavum:                 Appears normal         Ductal Arch:            Appears normal  Ventricles:            Appears normal         Diaphragm:              Appears normal  Choroid Plexus:        Appears normal         Stomach:                Appears normal, left  sided  Cerebellum:            Appears normal         Abdomen:                Appears normal  Posterior Fossa:       Appears normal         Abdominal Wall:         Appears nml (cord                                                                         insert, abd wall)  Nuchal Fold:           Appears normal         Cord Vessels:           Appears normal (3                                                                        vessel cord)  Face:                  Appears normal         Kidneys:                Appear normal                         (orbits and profile)  Lips:                  Appears normal         Bladder:                Appears normal  Thoracic:              Appears normal         Spine:                  Appears normal  Heart:                 Appears normal         Upper Extremities:      Appears normal                         (4CH, axis, and                         situs)  RVOT:                  Appears normal         Lower Extremities:      Appears normal  LVOT:                  Appears normal  Other:  Heels/feet and open hands/5th digits visualized. Nasal bone          visualized. ---------------------------------------------------------------------- Cervix Uterus Adnexa  Cervix  Length:           3.18  cm.  Normal appearance by transabdominal scan.  Uterus  No abnormality visualized.  Right Ovary  Within normal limits.  Left Ovary  Within normal limits.  Cul De Sac  No free fluid seen.  Adnexa  No abnormality visualized. ---------------------------------------------------------------------- Comments  This patient was seen for a detailed fetal anatomy scan due  to advanced maternal age.  She denies any significant past medical history and denies  any problems in her current pregnancy.  She had a cell free DNA test earlier in her pregnancy which  indicated a low risk for trisomy 5821, 9218, and 13. A female fetus is  predicted.  She was informed that the fetal growth and amniotic fluid  level were appropriate for her gestational age.  There were no obvious fetal anomalies noted on today's  ultrasound exam.  The patient was informed that anomalies may be missed due  to technical limitations. If the fetus is in a suboptimal position   or maternal habitus is increased, visualization of the fetus in  the maternal uterus may be impaired.  The increased risk of fetal aneuploidy due to advanced  maternal age was discussed. Due to advanced maternal age,  the patient was offered and declined an amniocentesis today  for definitive diagnosis of fetal aneuploidy.  She is  comfortable with her negative cell free DNA test.  Follow-up as indicated. ----------------------------------------------------------------------                   Ma RingsVictor Fang, MD Electronically Signed Final Report   11/03/2019 12:02 pm ----------------------------------------------------------------------   Assessment and Plan:  Pregnancy: G3P1011 at 1414w4d 1. Multigravida of advanced maternal age in second trimester Normal scan and low risk NIPS.  2. H/O gestational diabetes in prior pregnancy, currently pregnant Will do 2 hr GTT next visit (around 24 weeks); had negative A1C of 5.0 and glucose of 83 at NOB visit.  3. [redacted] weeks gestation of pregnancy 4. Supervision of high risk pregnancy, antepartum Normal anatomy scan. No other complaints or concerns.  Routine obstetric precautions reviewed.  I discussed the assessment and treatment plan with the patient. The patient was provided an opportunity to ask questions and all were answered. The patient agreed with the plan and demonstrated an understanding of the instructions. The patient was advised to call back or seek an in-person office evaluation/[facility] for any urgent or concerning symptoms. Please refer to After Visit Summary for other counseling recommendations.   I provided 7 minutes of face-to-face time during this encounter.  Return in about 5 weeks (around 12/11/2019) for 2 hr GTT (at 24 weeks due to history of GDM) and OFFICE OB Visit (no Tdap yet).   Jaynie CollinsUgonna Toula Miyasaki, MD [facility], [facility]

## 2019-11-06 NOTE — Progress Notes (Signed)
I connected with  [name] on 11/06/19 at  8:30 AM EDT by telephone and verified that I am speaking with the correct person using two identifiers.   I discussed the limitations, risks, security and privacy concerns of performing an evaluation and management service by telephone and the availability of in person appointments. I also discussed with the patient that there may be a patient responsible charge related to this service. The patient expressed understanding and agreed to proceed.  Scheryl Marten, RN 11/06/2019  8:17 AM

## 2019-12-04 ENCOUNTER — Encounter: Payer: PRIVATE HEALTH INSURANCE | Admitting: Family Medicine

## 2019-12-11 ENCOUNTER — Other Ambulatory Visit: Payer: Self-pay

## 2019-12-11 ENCOUNTER — Ambulatory Visit (INDEPENDENT_AMBULATORY_CARE_PROVIDER_SITE_OTHER): Payer: PRIVATE HEALTH INSURANCE | Admitting: Obstetrics & Gynecology

## 2019-12-11 VITALS — BP 95/63 | HR 69 | Wt 159.0 lb

## 2019-12-11 DIAGNOSIS — O09522 Supervision of elderly multigravida, second trimester: Secondary | ICD-10-CM

## 2019-12-11 DIAGNOSIS — O09299 Supervision of pregnancy with other poor reproductive or obstetric history, unspecified trimester: Secondary | ICD-10-CM

## 2019-12-11 DIAGNOSIS — Z3A24 24 weeks gestation of pregnancy: Secondary | ICD-10-CM

## 2019-12-11 DIAGNOSIS — Z8632 Personal history of gestational diabetes: Secondary | ICD-10-CM

## 2019-12-11 DIAGNOSIS — O0990 Supervision of high risk pregnancy, unspecified, unspecified trimester: Secondary | ICD-10-CM

## 2019-12-11 LAB — CBC
Hematocrit: 33.8 % — ABNORMAL LOW (ref 34.0–46.6)
Hemoglobin: 11.2 g/dL (ref 11.1–15.9)
MCH: 30.4 pg (ref 26.6–33.0)
MCHC: 33.1 g/dL (ref 31.5–35.7)
MCV: 92 fL (ref 79–97)
Platelets: 165 10*3/uL (ref 150–450)
RBC: 3.69 x10E6/uL — ABNORMAL LOW (ref 3.77–5.28)
RDW: 12.8 % (ref 11.7–15.4)
WBC: 10.3 10*3/uL (ref 3.4–10.8)

## 2019-12-11 NOTE — Patient Instructions (Signed)
Return to office for any scheduled appointments. Call the office or go to the [facility] at [facility] [place] if:  You begin to have strong, frequent contractions  Your water breaks.  Sometimes it is a big gush of fluid, sometimes it is just a trickle that keeps getting your panties wet or running down your legs  You have vaginal bleeding.  It is normal to have a small amount of spotting if your cervix was checked.   You do not feel your baby moving like normal.  If you do not, get something to eat and drink and lay down and focus on feeling your baby move.   If your baby is still not moving like normal, you should call the office or go to MAU.  Any other obstetric concerns.   

## 2019-12-11 NOTE — Progress Notes (Signed)
   PRENATAL VISIT NOTE  Subjective:  Jenna Leblanc is a 39 y.o. G3P1011 at [redacted]w[redacted]d being seen today for ongoing prenatal care.  She is currently monitored for the following issues for this high-risk pregnancy and has IBS (irritable bowel syndrome); AMA (advanced maternal age) multigravida 35+; and Supervision of high risk pregnancy, antepartum on their problem list.  Patient reports no complaints.  Contractions: Not present. Vag. Bleeding: None.  Movement: Present. Denies leaking of fluid.   The following portions of the patient's history were reviewed and updated as appropriate: allergies, current medications, past family history, past medical history, past social history, past surgical history and problem list.   Objective:   Vitals:   12/11/19 0837  BP: 95/63  Pulse: 69  Weight: 159 lb (72.1 kg)    Fetal Status: Fetal Heart Rate (bpm): 139 Fundal Height: 24 cm Movement: Present     General:  Alert, oriented and cooperative. Patient is in no acute distress.  Skin: Skin is warm and dry. No rash noted.   Cardiovascular: Normal heart rate noted  Respiratory: Normal respiratory effort, no problems with respiration noted  Abdomen: Soft, gravid, appropriate for gestational age.  Pain/Pressure: Present     Pelvic: Cervical exam deferred        Extremities: Normal range of motion.  Edema: Trace  Mental Status: Normal mood and affect. Normal behavior. Normal judgment and thought content.   Assessment and Plan:  Pregnancy: G3P1011 at [redacted]w[redacted]d 1. H/O gestational diabetes in prior pregnancy, currently pregnant 2. [redacted] weeks gestation of pregnancy 3.  Multigravida of advanced maternal age in second trimester 4. Supervision of high risk pregnancy, antepartum Labs done today givn h/o GDM, will follow up results and manage accordingly. - Glucose Tolerance, 2 Hours w/1 Hour - CBC - RPR - HIV Antibody (routine testing w rflx) Answered questions about COVID vaccine, emphasized that ACOG and SMFM  recommend vaccination in pregnancy but patient can make informed decision. No other concerns. Preterm labor symptoms and general obstetric precautions including but not limited to vaginal bleeding, contractions, leaking of fluid and fetal movement were reviewed in detail with the patient. Please refer to After Visit Summary for other counseling recommendations.   Return in about 4 weeks (around 01/08/2020) for OFFICE OB Visit.  No future appointments.  Jaynie Collins, MD

## 2019-12-12 LAB — GLUCOSE TOLERANCE, 2 HOURS W/ 1HR
Glucose, 1 hour: 151 mg/dL (ref 65–179)
Glucose, 2 hour: 128 mg/dL (ref 65–152)
Glucose, Fasting: 80 mg/dL (ref 65–91)

## 2019-12-12 LAB — HIV ANTIBODY (ROUTINE TESTING W REFLEX): HIV Screen 4th Generation wRfx: NONREACTIVE

## 2019-12-12 LAB — RPR: RPR Ser Ql: NONREACTIVE

## 2019-12-26 ENCOUNTER — Ambulatory Visit (INDEPENDENT_AMBULATORY_CARE_PROVIDER_SITE_OTHER): Payer: PRIVATE HEALTH INSURANCE | Admitting: Family Medicine

## 2019-12-26 ENCOUNTER — Other Ambulatory Visit: Payer: Self-pay

## 2019-12-26 ENCOUNTER — Encounter: Payer: Self-pay | Admitting: Family Medicine

## 2019-12-26 VITALS — BP 104/58 | HR 77 | Temp 97.0°F | Ht 63.0 in | Wt 159.4 lb

## 2019-12-26 DIAGNOSIS — R350 Frequency of micturition: Secondary | ICD-10-CM

## 2019-12-26 DIAGNOSIS — R102 Pelvic and perineal pain: Secondary | ICD-10-CM | POA: Insufficient documentation

## 2019-12-26 LAB — POCT WET PREP (WET MOUNT): Trichomonas Wet Prep HPF POC: ABSENT

## 2019-12-26 LAB — POC URINALSYSI DIPSTICK (AUTOMATED)
Bilirubin, UA: NEGATIVE
Blood, UA: NEGATIVE
Glucose, UA: NEGATIVE
Ketones, UA: NEGATIVE
Nitrite, UA: NEGATIVE
Protein, UA: POSITIVE — AB
Spec Grav, UA: >=1.03 — AB (ref 1.010–1.025)
Urobilinogen, UA: 0.2 E.U./dL
pH, UA: 6 (ref 5.0–8.0)

## 2019-12-26 MED ORDER — TERCONAZOLE 0.4 % VA CREA: 1.0000 | TOPICAL_CREAM | Freq: Every day | VAGINAL | 0 refills | Status: DC

## 2019-12-26 NOTE — Progress Notes (Signed)
Subjective:    Patient ID: Jenna Leblanc, female    DOB: [birth year], 39 y.o.   MRN: [id number]  This visit occurred during the SARS-CoV-2 public health emergency.  Safety protocols were in place, including screening questions prior to the visit, additional usage of staff PPE, and extensive cleaning of exam room while observing appropriate contact time as indicated for disinfecting solutions.    HPI 39 yo pt of Dr Alphonsus Sias presents with vaginal swelling/discomfort and urinary frequency   She is pregnant (EDD is in November)   Was on vacation/wearing a bathing suit a lot  Started to have some sensitive skin in perineal area-so she stopped shaving Then pain with wiping labia  Now more painful/worse  Used hycrocort cream  (px)  Neosporin  Ice helps the most  Hurts to walk Some itching- it just started about a day ago  ? 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 Chafing or razor burn   Some urinary frequency   ua and wet prep  Results for orders placed or performed in visit on 12/26/19  POCT Urinalysis Dipstick (Automated)  Result Value Ref Range   Color, UA Yellow    Clarity, UA Cloudy    Glucose, UA Negative Negative   Bilirubin, UA Negative    Ketones, UA Negative    Spec Grav, UA >=1.030 (A) 1.010 - 1.025   Blood, UA Negative    pH, UA 6.0 5.0 - 8.0   Protein, UA Positive (A) Negative   Urobilinogen, UA 0.2 0.2 or 1.0 E.U./dL   Nitrite, UA Negative    Leukocytes, UA Small (1+) (A) Negative  POCT Wet Prep ([place]et [place])  Result Value Ref Range   Source Wet Prep POC vag    WBC, Wet Prep HPF POC few    Bacteria Wet Prep HPF POC Moderate (A) Few   BACTERIA WET PREP MORPHOLOGY POC     Clue Cells Wet Prep HPF POC None None   Clue Cells Wet Prep Whiff POC     Yeast  Wet Prep HPF POC Few (A) None   KOH Wet Prep POC Few (A) None   Trichomonas Wet Prep HPF POC Absent Absent    Patient Active Problem List   Diagnosis Date Noted  . Vaginal pain 12/26/2019  . Urinary frequency 12/26/2019  . AMA (advanced maternal age) multigravida 35+ 08/21/2019  . Supervision of high risk pregnancy, antepartum 08/21/2019  . IBS (irritable bowel syndrome) 04/27/2015   Past Medical History:  Diagnosis Date  . Family history of hypertrophic cardiomyopathy 06/07/2016  . Gestational diabetes    diet controlled  . H/O gestational diabetes in prior pregnancy, currently pregnant 08/21/2019   Early a1c negative  . IBS (irritable bowel syndrome)    Diarrhea type  . Kidney stones    none in 8 years  . Oral herpes simplex infection    Never had genital   Past Surgical History:  Procedure Laterality Date  . LITHOTRIPSY    . VAGINAL DELIVERY     Social History   Tobacco Use  . Smoking status: Former Smoker    Quit date: 07/15/2006    Years since quitting: 13.4  . Smokeless tobacco: Never Used  Vaping Use  . Vaping Use: Never used  Substance Use Topics  . Alcohol use: Not Currently    Alcohol/week: 0.0 standard drinks    Comment: 2-3 times per week  . Drug use: No   Family History  Problem Relation Age of Onset  . Cancer Sister        sarcoma in the uterus  . Cancer Maternal Grandmother        lung  . Diabetes Maternal Grandmother   . Heart attack Maternal Grandfather   . Pneumonia Paternal Grandmother   . Diabetes Paternal Grandmother   . Cancer Paternal Grandfather        ?spine   No Known Allergies Current Outpatient Medications on File Prior to Visit  Medication Sig Dispense Refill  . acyclovir (ZOVIRAX) 400 MG tablet One tab po tid x 5 days for fever blister. One tab po bid for suppression 60 tablet 3  . aspirin EC 81 MG tablet Take 1 tablet (81 mg total) by mouth daily. Take after 12 weeks for prevention of preeclampsia later in pregnancy 300 tablet  2  . Cholecalciferol (VITAMIN D) 50 MCG (2000 UT) tablet Take 1 tablet (2,000 Units total) by mouth daily. 90 tablet 1  . ondansetron (ZOFRAN) 4 MG tablet TAKE 1 TABLET BY MOUTH EVERY 8 HOURS AS NEEDED FOR NAUSEA AND VOMITING 20 tablet 2  . Prenatal Vit-Fe Fumarate-FA (MULTIVITAMIN-PRENATAL) 27-0.8 MG TABS tablet Take 1 tablet by mouth daily at 12 noon.    . vitamin B-12 (CYANOCOBALAMIN) 500 MCG tablet Take 500 mcg by mouth daily.     No current facility-administered medications on file prior to visit.    Review of Systems  Constitutional: Negative for activity change, appetite change, fatigue, fever and unexpected weight change.  HENT: Negative for congestion, ear pain, rhinorrhea, sinus pressure and sore throat.   Eyes: Negative for pain, redness and visual disturbance.  Respiratory: Negative for cough, shortness of breath and wheezing.   Cardiovascular: Negative for chest pain and palpitations.  Gastrointestinal: Negative for abdominal pain, blood in stool, constipation and diarrhea.  Endocrine: Negative for polydipsia and polyuria.  Genitourinary: Positive for frequency and vaginal pain. Negative for dysuria, hematuria, pelvic pain and urgency.  Musculoskeletal: Negative for arthralgias, back pain and myalgias.  Skin: Negative for pallor and rash.       Had a rash on upper thighs that is gone  Allergic/Immunologic: Negative for environmental allergies.  Neurological: Negative for dizziness, syncope and headaches.  Hematological: Negative for adenopathy. Does not bruise/bleed easily.  Psychiatric/Behavioral: Negative for decreased concentration and dysphoric mood. The patient is not nervous/anxious.        Objective:   Physical Exam Exam conducted with a chaperone present.  Constitutional:      General: She is not in acute distress.    Appearance: Normal appearance. She is not ill-appearing or diaphoretic.  Eyes:     Conjunctiva/sclera: Conjunctivae normal.     Pupils: Pupils  are equal, round, and reactive to light.  Cardiovascular:     Rate and Rhythm: Normal rate and regular rhythm.  Abdominal:     Tenderness: There is no abdominal tenderness.     Comments: No suprapubic tenderness or fullness    Genitourinary:    Pubic Area: No rash.      Labia:        Right: Tenderness present.        Left: Tenderness present.      Vagina: Vaginal discharge present.  Comments: Mild hyperemia and swelling of labia maj/minora -a bit worse on the L  No lesions or open areas or scabs noted  Swab obt for wet prep   Scant pale vaginal d/c  Neurological:     Mental Status: She is alert.  Psychiatric:        Mood and Affect: Mood normal.           Assessment & Plan:   Problem List Items Addressed This Visit      Other   Vaginal pain - Primary    With swelling/pain of labia and itching (? If discharge-difficult to tell) Exam is reassuring   Wet prep shows hyphae  Pt is pregnant with EDC in November  Treat with terazol 7 cream  inst to keep dry as much as possible Update if not starting to improve in a week or if worsening        Relevant Orders   POCT Wet Prep [employer]) (Completed)   Urinary frequency    Urine looked concentrated  Had some wbc in ua (? If contaminant with vaginitis) Sent for cx -will contact when it returns and tx if pos inst to call if symptoms worsen or change  Also inst to drink more water        Relevant Orders   POCT Urinalysis Dipstick (Automated) (Completed)   Urine Culture

## 2019-12-26 NOTE — Assessment & Plan Note (Signed)
Urine looked concentrated  Had some wbc in ua (? If contaminant with vaginitis) Sent for cx -will contact when it returns and tx if pos inst to call if symptoms worsen or change  Also inst to drink more water

## 2019-12-26 NOTE — Patient Instructions (Addendum)
We will culture your urine today and contact you with a result   I do see yeast on your wet prep swab  Use the terazol 7 cream as directed   Update if not starting to improve in a week or if worsening

## 2019-12-26 NOTE — Assessment & Plan Note (Signed)
With swelling/pain of labia and itching (? If discharge-difficult to tell) Exam is reassuring   Wet prep shows hyphae  Pt is pregnant with EDC in November  Treat with terazol 7 cream  inst to keep dry as much as possible Update if not starting to improve in a week or if worsening

## 2019-12-27 ENCOUNTER — Encounter: Payer: Self-pay | Admitting: Family Medicine

## 2019-12-28 LAB — URINE CULTURE
MICRO NUMBER:: 10853081
Result:: NO GROWTH
SPECIMEN QUALITY:: ADEQUATE

## 2020-01-07 NOTE — Progress Notes (Signed)
   PRENATAL VISIT NOTE  Subjective:  Jenna Leblanc is a 39 y.o. G3P1011 at [redacted]w[redacted]d being seen today for ongoing prenatal care.  She is currently monitored for the following issues for this low-risk pregnancy and has IBS (irritable bowel syndrome) and Encounter for supervision of multigravida of advanced maternal age in third trimester on their problem list.  Patient reports no complaints.  Contractions: Not present.  .  Movement: Present. Denies leaking of fluid.   The following portions of the patient's history were reviewed and updated as appropriate: allergies, current medications, past family history, past medical history, past social history, past surgical history and problem list.   Objective:   Vitals:   01/08/20 0831  BP: 120/75  Pulse: 72  Weight: 160 lb 8 oz (72.8 kg)    Fetal Status: Fetal Heart Rate (bpm): 137 Fundal Height: 28 cm Movement: Present     General:  Alert, oriented and cooperative. Patient is in no acute distress.  Skin: Skin is warm and dry. No rash noted.   Cardiovascular: Normal heart rate noted  Respiratory: Normal respiratory effort, no problems with respiration noted  Abdomen: Soft, gravid, appropriate for gestational age.  Pain/Pressure: Absent     Pelvic: Cervical exam deferred        Extremities: Normal range of motion.  Edema: None  Mental Status: Normal mood and affect. Normal behavior. Normal judgment and thought content.   Assessment and Plan:  Pregnancy: G3P1011 at [redacted]w[redacted]d 1. [redacted] weeks gestation of pregnancy 2. Encounter for supervision of multigravida of advanced maternal age in third trimester Normal 2 hr GTT at 24 weeks. Fundal height appropriate. No other concerns. Tdap vaccine given.  - Tdap vaccine greater than or equal to 7yo IM Preterm labor symptoms and general obstetric precautions including but not limited to vaginal bleeding, contractions, leaking of fluid and fetal movement were reviewed in detail with the patient. Please refer to  After Visit Summary for other counseling recommendations.   Return in about 2 weeks (around 01/22/2020) for OFFICE OB Visit.  No future appointments.  Jaynie Collins, MD

## 2020-01-08 ENCOUNTER — Ambulatory Visit (INDEPENDENT_AMBULATORY_CARE_PROVIDER_SITE_OTHER): Payer: PRIVATE HEALTH INSURANCE | Admitting: Obstetrics & Gynecology

## 2020-01-08 ENCOUNTER — Other Ambulatory Visit: Payer: Self-pay

## 2020-01-08 VITALS — BP 120/75 | HR 72 | Wt 160.5 lb

## 2020-01-08 DIAGNOSIS — Z23 Encounter for immunization: Secondary | ICD-10-CM

## 2020-01-08 DIAGNOSIS — Z3A28 28 weeks gestation of pregnancy: Secondary | ICD-10-CM | POA: Diagnosis not present

## 2020-01-08 DIAGNOSIS — O09523 Supervision of elderly multigravida, third trimester: Secondary | ICD-10-CM

## 2020-01-08 NOTE — Patient Instructions (Signed)
Return to office for any scheduled appointments. Call the office or go to the MAU at [facility] [place] if:  You begin to have strong, frequent contractions  Your water breaks.  Sometimes it is a big gush of fluid, sometimes it is just a trickle that keeps getting your panties wet or running down your legs  You have vaginal bleeding.  It is normal to have a small amount of spotting if your cervix was checked.   You do not feel your baby moving like normal.  If you do not, get something to eat and drink and lay down and focus on feeling your baby move.   If your baby is still not moving like normal, you should call the office or go to MAU.  Any other obstetric concerns.   Third Trimester of Pregnancy The third trimester is from week 28 through week 40 (months 7 through 9). The third trimester is a time when the unborn baby (fetus) is growing rapidly. At the end of the ninth month, the fetus is about 20 inches in length and weighs 6-10 pounds. Body changes during your third trimester Your body will continue to go through many changes during pregnancy. The changes vary from woman to woman. During the third trimester:  Your weight will continue to increase. You can expect to gain 25-35 pounds (11-16 kg) by the end of the pregnancy.  You may begin to get stretch marks on your hips, abdomen, and breasts.  You may urinate more often because the fetus is moving lower into your pelvis and pressing on your bladder.  You may develop or continue to have heartburn. This is caused by increased hormones that slow down muscles in the digestive tract.  You may develop or continue to have constipation because increased hormones slow digestion and cause the muscles that push waste through your intestines to relax.  You may develop hemorrhoids. These are swollen veins (varicose veins) in the rectum that can itch or be painful.  You may develop swollen, bulging veins (varicose veins)  in your legs.  You may have increased body aches in the pelvis, back, or thighs. This is due to weight gain and increased hormones that are relaxing your joints.  You may have changes in your hair. These can include thickening of your hair, rapid growth, and changes in texture. Some women also have hair loss during or after pregnancy, or hair that feels dry or thin. Your hair will most likely return to normal after your baby is born.  Your breasts will continue to grow and they will continue to become tender. A yellow fluid (colostrum) may leak from your breasts. This is the first milk you are producing for your baby.  Your belly button may stick out.  You may notice more swelling in your hands, face, or ankles.  You may have increased tingling or numbness in your hands, arms, and legs. The skin on your belly may also feel numb.  You may feel short of breath because of your expanding uterus.  You may have more problems sleeping. This can be caused by the size of your belly, increased need to urinate, and an increase in your body's metabolism.  You may notice the fetus "dropping," or moving lower in your abdomen (lightening).  You may have increased vaginal discharge.  You may notice your joints feel loose and you may have pain around your pelvic bone. What to expect at prenatal visits You will have   prenatal exams every 2 weeks until week 36. Then you will have weekly prenatal exams. During a routine prenatal visit:  You will be weighed to make sure you and the baby are growing normally.  Your blood pressure will be taken.  Your abdomen will be measured to track your baby's growth.  The fetal heartbeat will be listened to.  Any test results from the previous visit will be discussed.  You may have a cervical check near your due date to see if your cervix has softened or thinned (effaced).  You will be tested for Group B streptococcus. This happens between 35 and 37 weeks. Your  health care provider may ask you:  What your birth plan is.  How you are feeling.  If you are feeling the baby move.  If you have had any abnormal symptoms, such as leaking fluid, bleeding, severe headaches, or abdominal cramping.  If you are using any tobacco products, including cigarettes, chewing tobacco, and electronic cigarettes.  If you have any questions. Other tests or screenings that may be performed during your third trimester include:  Blood tests that check for low iron levels (anemia).  Fetal testing to check the health, activity level, and growth of the fetus. Testing is done if you have certain medical conditions or if there are problems during the pregnancy.  Nonstress test (NST). This test checks the health of your baby to make sure there are no signs of problems, such as the baby not getting enough oxygen. During this test, a belt is placed around your belly. The baby is made to move, and its heart rate is monitored during movement. What is false labor? False labor is a condition in which you feel small, irregular tightenings of the muscles in the womb (contractions) that usually go away with rest, changing position, or drinking water. These are called Braxton Hicks contractions. Contractions may last for hours, days, or even weeks before true labor sets in. If contractions come at regular intervals, become more frequent, increase in intensity, or become painful, you should see your health care provider. What are the signs of labor?  Abdominal cramps.  Regular contractions that start at 10 minutes apart and become stronger and more frequent with time.  Contractions that start on the top of the uterus and spread down to the lower abdomen and back.  Increased pelvic pressure and dull back pain.  A watery or bloody mucus discharge that comes from the vagina.  Leaking of amniotic fluid. This is also known as your "water breaking." It could be a slow trickle or a gush.  Let your health care provider know if it has a color or strange odor. If you have any of these signs, call your health care provider right away, even if it is before your due date. Follow these instructions at home: Medicines  Follow your health care provider's instructions regarding medicine use. Specific medicines may be either safe or unsafe to take during pregnancy.  Take a prenatal vitamin that contains at least 600 micrograms (mcg) of folic acid.  If you develop constipation, try taking a stool softener if your health care provider approves. Eating and drinking   Eat a balanced diet that includes fresh fruits and vegetables, whole grains, good sources of protein such as meat, eggs, or tofu, and low-fat dairy. Your health care provider will help you determine the amount of weight gain that is right for you.  Avoid raw meat and uncooked cheese. These carry germs that   can cause birth defects in the baby.  If you have low calcium intake from food, talk to your health care provider about whether you should take a daily calcium supplement.  Eat four or five small meals rather than three large meals a day.  Limit foods that are high in fat and processed sugars, such as fried and sweet foods.  To prevent constipation: ? Drink enough fluid to keep your urine clear or pale yellow. ? Eat foods that are high in fiber, such as fresh fruits and vegetables, whole grains, and beans. Activity  Exercise only as directed by your health care provider. Most women can continue their usual exercise routine during pregnancy. Try to exercise for 30 minutes at least 5 days a week. Stop exercising if you experience uterine contractions.  Avoid heavy lifting.  Do not exercise in extreme heat or humidity, or at high altitudes.  Wear low-heel, comfortable shoes.  Practice good posture.  You may continue to have sex unless your health care provider tells you otherwise. Relieving pain and  discomfort  Take frequent breaks and rest with your legs elevated if you have leg cramps or low back pain.  Take warm sitz baths to soothe any pain or discomfort caused by hemorrhoids. Use hemorrhoid cream if your health care provider approves.  Wear a good support bra to prevent discomfort from breast tenderness.  If you develop varicose veins: ? Wear support pantyhose or compression stockings as told by your healthcare provider. ? Elevate your feet for 15 minutes, 3-4 times a day. Prenatal care  Write down your questions. Take them to your prenatal visits.  Keep all your prenatal visits as told by your health care provider. This is important. Safety  Wear your seat belt at all times when driving.  Make a list of emergency phone numbers, including numbers for family, friends, the hospital, and police and fire departments. General instructions  Avoid cat litter boxes and soil used by cats. These carry germs that can cause birth defects in the baby. If you have a cat, ask someone to clean the litter box for you.  Do not travel far distances unless it is absolutely necessary and only with the approval of your health care provider.  Do not use hot tubs, steam rooms, or saunas.  Do not drink alcohol.  Do not use any products that contain nicotine or tobacco, such as cigarettes and e-cigarettes. If you need help quitting, ask your health care provider.  Do not use any medicinal herbs or unprescribed drugs. These chemicals affect the formation and growth of the baby.  Do not douche or use tampons or scented sanitary pads.  Do not cross your legs for long periods of time.  To prepare for the arrival of your baby: ? Take prenatal classes to understand, practice, and ask questions about labor and delivery. ? Make a trial run to the hospital. ? Visit the hospital and tour the maternity area. ? Arrange for maternity or paternity leave through employers. ? Arrange for family and  friends to take care of pets while you are in the hospital. ? Purchase a rear-facing car seat and make sure you know how to install it in your car. ? Pack your hospital bag. ? Prepare the baby's nursery. Make sure to remove all pillows and stuffed animals from the baby's crib to prevent suffocation.  Visit your dentist if you have not gone during your pregnancy. Use a soft toothbrush to brush your teeth and be   gentle when you floss. Contact a health care provider if:  You are unsure if you are in labor or if your water has broken.  You become dizzy.  You have mild pelvic cramps, pelvic pressure, or nagging pain in your abdominal area.  You have lower back pain.  You have persistent nausea, vomiting, or diarrhea.  You have an unusual or bad smelling vaginal discharge.  You have pain when you urinate. Get help right away if:  Your water breaks before 37 weeks.  You have regular contractions less than 5 minutes apart before 37 weeks.  You have a fever.  You are leaking fluid from your vagina.  You have spotting or bleeding from your vagina.  You have severe abdominal pain or cramping.  You have rapid weight loss or weight gain.  You have shortness of breath with chest pain.  You notice sudden or extreme swelling of your face, hands, ankles, feet, or legs.  Your baby makes fewer than 10 movements in 2 hours.  You have severe headaches that do not go away when you take medicine.  You have vision changes. Summary  The third trimester is from week 28 through week 40, months 7 through 9. The third trimester is a time when the unborn baby (fetus) is growing rapidly.  During the third trimester, your discomfort may increase as you and your baby continue to gain weight. You may have abdominal, leg, and back pain, sleeping problems, and an increased need to urinate.  During the third trimester your breasts will keep growing and they will continue to become tender. A yellow  fluid (colostrum) may leak from your breasts. This is the first milk you are producing for your baby.  False labor is a condition in which you feel small, irregular tightenings of the muscles in the womb (contractions) that eventually go away. These are called Braxton Hicks contractions. Contractions may last for hours, days, or even weeks before true labor sets in.  Signs of labor can include: abdominal cramps; regular contractions that start at 10 minutes apart and become stronger and more frequent with time; watery or bloody mucus discharge that comes from the vagina; increased pelvic pressure and dull back pain; and leaking of amniotic fluid. This information is not intended to replace advice given to you by your health care provider. Make sure you discuss any questions you have with your health care provider. Document Revised: 08/15/2018 Document Reviewed: 05/30/2016 Elsevier Patient Education  2020 [employer]  

## 2020-01-27 ENCOUNTER — Ambulatory Visit (INDEPENDENT_AMBULATORY_CARE_PROVIDER_SITE_OTHER): Payer: PRIVATE HEALTH INSURANCE | Admitting: Family Medicine

## 2020-01-27 VITALS — BP 96/66 | HR 80 | Wt 165.8 lb

## 2020-01-27 DIAGNOSIS — O09523 Supervision of elderly multigravida, third trimester: Secondary | ICD-10-CM

## 2020-01-27 NOTE — Progress Notes (Signed)
° °  PRENATAL VISIT NOTE  Subjective:  Jenna Leblanc is a 39 y.o. G3P1011 at [redacted]w[redacted]d being seen today for ongoing prenatal care.  She is currently monitored for the following issues for this low-risk pregnancy and has IBS (irritable bowel syndrome) and Encounter for supervision of multigravida of advanced maternal age in third trimester on their problem list.  Patient reports nausea and dizziness yesterday x 1.  Contractions: Irregular. Vag. Bleeding: None.  Movement: Present. Denies leaking of fluid.   The following portions of the patient's history were reviewed and updated as appropriate: allergies, current medications, past family history, past medical history, past social history, past surgical history and problem list.   Objective:   Vitals:   01/27/20 0815  BP: 96/66  Pulse: 80  Weight: 165 lb 12.8 oz (75.2 kg)    Fetal Status: Fetal Heart Rate (bpm): 136 Fundal Height: 30 cm Movement: Present     General:  Alert, oriented and cooperative. Patient is in no acute distress.  Skin: Skin is warm and dry. No rash noted.   Cardiovascular: Normal heart rate noted  Respiratory: Normal respiratory effort, no problems with respiration noted  Abdomen: Soft, gravid, appropriate for gestational age.  Pain/Pressure: Present     Pelvic: Cervical exam deferred        Extremities: Normal range of motion.  Edema: Trace  Mental Status: Normal mood and affect. Normal behavior. Normal judgment and thought content.   Assessment and Plan:  Pregnancy: G3P1011 at [redacted]w[redacted]d 1. Encounter for supervision of multigravida of advanced maternal age in third trimester No GDM - tough following diet Normal NIPT Suspect, she might have gotten mildly hypoglycemic yesterday--advised to eat frequent small meals.   Preterm labor symptoms and general obstetric precautions including but not limited to vaginal bleeding, contractions, leaking of fluid and fetal movement were reviewed in detail with the patient. Please  refer to After Visit Summary for other counseling recommendations.   Return in 2 weeks (on 02/10/2020).  Future Appointments  Date Time Provider Department Center  02/10/2020  9:45 AM Anyanwu, Jethro Bastos, MD [facility]  02/24/2020  8:30 AM Reva Bores, MD [facility]  03/09/2020  8:45 AM [place] Bing, MD [facility]  03/16/2020  8:15 AM Reva Bores, MD [facility]  03/23/2020  8:15 AM Reva Bores, MD [facility]    Reva Bores, MD

## 2020-01-27 NOTE — Patient Instructions (Signed)

## 2020-02-10 ENCOUNTER — Other Ambulatory Visit: Payer: Self-pay

## 2020-02-10 ENCOUNTER — Ambulatory Visit (INDEPENDENT_AMBULATORY_CARE_PROVIDER_SITE_OTHER): Payer: PRIVATE HEALTH INSURANCE | Admitting: Obstetrics & Gynecology

## 2020-02-10 VITALS — BP 112/72 | HR 80 | Wt 168.0 lb

## 2020-02-10 DIAGNOSIS — Z3A33 33 weeks gestation of pregnancy: Secondary | ICD-10-CM | POA: Diagnosis not present

## 2020-02-10 DIAGNOSIS — Z23 Encounter for immunization: Secondary | ICD-10-CM | POA: Diagnosis not present

## 2020-02-10 DIAGNOSIS — O09523 Supervision of elderly multigravida, third trimester: Secondary | ICD-10-CM

## 2020-02-10 NOTE — Progress Notes (Signed)
   PRENATAL VISIT NOTE  Subjective:  Jenna Leblanc is a 39 y.o. G3P1011 at [redacted]w[redacted]d being seen today for ongoing prenatal care.  She is currently monitored for the following issues for this low-risk pregnancy and has IBS (irritable bowel syndrome) and Encounter for supervision of multigravida of advanced maternal age in third trimester on their problem list.  Patient reports no complaints.  Contractions: Irregular. Vag. Bleeding: None.  Movement: Present. Denies leaking of fluid.   The following portions of the patient's history were reviewed and updated as appropriate: allergies, current medications, past family history, past medical history, past social history, past surgical history and problem list.   Objective:   Vitals:   02/10/20 1000  BP: 112/72  Pulse: 80  Weight: 168 lb (76.2 kg)    Fetal Status: Fetal Heart Rate (bpm): 131 Fundal Height: 33 cm Movement: Present     General:  Alert, oriented and cooperative. Patient is in no acute distress.  Skin: Skin is warm and dry. No rash noted.   Cardiovascular: Normal heart rate noted  Respiratory: Normal respiratory effort, no problems with respiration noted  Abdomen: Soft, gravid, appropriate for gestational age.  Pain/Pressure: Present     Pelvic: Cervical exam deferred        Extremities: Normal range of motion.  Edema: Trace  Mental Status: Normal mood and affect. Normal behavior. Normal judgment and thought content.   Assessment and Plan:  Pregnancy: G3P1011 at [redacted]w[redacted]d 1. [redacted] weeks gestation of pregnancy 2. Encounter for supervision of multigravida of advanced maternal age in third trimester Considering elective IOL at 39 weeks, not decided yet. 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 Flu show today, declined COVID vaccine.  Preterm labor  symptoms and general obstetric precautions including but not limited to vaginal bleeding, contractions, leaking of fluid and fetal movement were reviewed in detail with the patient. Please refer to After Visit Summary for other counseling recommendations.   Return in about 2 weeks (around 02/24/2020) for OFFICE OB Visit.  Future Appointments  Date Time Provider Department Center  02/24/2020  8:30 AM Reva Bores, MD [facility]  03/09/2020  8:45 AM [place] Bing, MD [facility]  03/16/2020  8:15 AM Reva Bores, MD [facility]  03/23/2020  8:15 AM Reva Bores, MD [facility]    Jaynie Collins, MD

## 2020-02-10 NOTE — Patient Instructions (Signed)
Return to office for any scheduled appointments. Call the office or go to the MAU at [facility] [place] if:  You begin to have strong, frequent contractions  Your water breaks.  Sometimes it is a big gush of fluid, sometimes it is just a trickle that keeps getting your panties wet or running down your legs  You have vaginal bleeding.  It is normal to have a small amount of spotting if your cervix was checked.   You do not feel your baby moving like normal.  If you do not, get something to eat and drink and lay down and focus on feeling your baby move.   If your baby is still not moving like normal, you should call the office or go to MAU.  Any other obstetric concerns.   

## 2020-02-24 ENCOUNTER — Other Ambulatory Visit: Payer: Self-pay

## 2020-02-24 ENCOUNTER — Ambulatory Visit (INDEPENDENT_AMBULATORY_CARE_PROVIDER_SITE_OTHER): Payer: PRIVATE HEALTH INSURANCE | Admitting: Family Medicine

## 2020-02-24 VITALS — BP 106/70 | HR 77 | Wt 170.6 lb

## 2020-02-24 DIAGNOSIS — O09523 Supervision of elderly multigravida, third trimester: Secondary | ICD-10-CM

## 2020-02-24 NOTE — Progress Notes (Signed)
   PRENATAL VISIT NOTE  Subjective:  Jenna Leblanc is a 39 y.o. G3P1011 at [redacted]w[redacted]d being seen today for ongoing prenatal care.  She is currently monitored for the following issues for this low-risk pregnancy and has IBS (irritable bowel syndrome) and Encounter for supervision of multigravida of advanced maternal age in third trimester on their problem list.  Patient reports no complaints.  Contractions: Irritability. Vag. Bleeding: None.  Movement: Present. Denies leaking of fluid.   The following portions of the patient's history were reviewed and updated as appropriate: allergies, current medications, past family history, past medical history, past social history, past surgical history and problem list.   Objective:   Vitals:   02/24/20 0836  BP: 106/70  Pulse: 77  Weight: 170 lb 9.6 oz (77.4 kg)    Fetal Status: Fetal Heart Rate (bpm): 156 Fundal Height: 32 cm Movement: Present     General:  Alert, oriented and cooperative. Patient is in no acute distress.  Skin: Skin is warm and dry. No rash noted.   Cardiovascular: Normal heart rate noted  Respiratory: Normal respiratory effort, no problems with respiration noted  Abdomen: Soft, gravid, appropriate for gestational age.  Pain/Pressure: Present     Pelvic: Cervical exam deferred        Extremities: Normal range of motion.  Edema: Trace  Mental Status: Normal mood and affect. Normal behavior. Normal judgment and thought content.   Assessment and Plan:  Pregnancy: G3P1011 at [redacted]w[redacted]d 1. Encounter for supervision of multigravida of advanced maternal age in third trimester Continue routine prenatal care. Cultures next week  Preterm labor symptoms and general obstetric precautions including but not limited to vaginal bleeding, contractions, leaking of fluid and fetal movement were reviewed in detail with the patient. Please refer to After Visit Summary for other counseling recommendations.   Return in 1 week (on  03/02/2020).  Future Appointments  Date Time Provider Department Center  03/03/2020  2:30 PM Briant Sites [facility]  03/09/2020  8:45 AM [place] Bing, MD [facility]  03/16/2020  8:15 AM Reva Bores, MD [facility]  03/23/2020  8:15 AM Reva Bores, MD [facility]    Reva Bores, MD

## 2020-02-24 NOTE — Patient Instructions (Signed)

## 2020-02-27 ENCOUNTER — Inpatient Hospital Stay (HOSPITAL_COMMUNITY)
Admission: AD | Admit: 2020-02-27 | Discharge: 2020-02-28 | Disposition: A | Discharge status: Home / Self Care | Payer: PRIVATE HEALTH INSURANCE | Attending: Family Medicine | Admitting: Family Medicine

## 2020-02-27 ENCOUNTER — Other Ambulatory Visit: Payer: Self-pay

## 2020-02-27 DIAGNOSIS — O4703 False labor before 37 completed weeks of gestation, third trimester: Secondary | ICD-10-CM

## 2020-02-27 DIAGNOSIS — Z7982 Long term (current) use of aspirin: Secondary | ICD-10-CM | POA: Insufficient documentation

## 2020-02-27 DIAGNOSIS — B001 Herpesviral vesicular dermatitis: Secondary | ICD-10-CM | POA: Diagnosis not present

## 2020-02-27 DIAGNOSIS — Z87891 Personal history of nicotine dependence: Secondary | ICD-10-CM | POA: Insufficient documentation

## 2020-02-27 DIAGNOSIS — Z3A35 35 weeks gestation of pregnancy: Secondary | ICD-10-CM

## 2020-02-27 DIAGNOSIS — Z79899 Other long term (current) drug therapy: Secondary | ICD-10-CM | POA: Diagnosis not present

## 2020-02-27 DIAGNOSIS — O98513 Other viral diseases complicating pregnancy, third trimester: Secondary | ICD-10-CM | POA: Diagnosis not present

## 2020-02-27 DIAGNOSIS — O09523 Supervision of elderly multigravida, third trimester: Secondary | ICD-10-CM | POA: Insufficient documentation

## 2020-02-27 LAB — URINALYSIS, ROUTINE W REFLEX MICROSCOPIC
Bilirubin Urine: NEGATIVE
Glucose, UA: NEGATIVE mg/dL
Hgb urine dipstick: NEGATIVE
Ketones, ur: 20 mg/dL — AB
Nitrite: NEGATIVE
Protein, ur: NEGATIVE mg/dL
Specific Gravity, Urine: 1.006 (ref 1.005–1.030)
pH: 5 (ref 5.0–8.0)

## 2020-02-27 MED ORDER — LACTATED RINGERS IV BOLUS
1000.0000 mL | Freq: Once | INTRAVENOUS | Status: AC
  Administered 2020-02-27: 1000 mL via INTRAVENOUS

## 2020-02-27 MED ORDER — BETAMETHASONE SOD PHOS & ACET 6 (3-3) MG/ML IJ SUSP
12.0000 mg | INTRAMUSCULAR | Status: DC
  Administered 2020-02-27: 12 mg via INTRAMUSCULAR
  Filled 2020-02-27: qty 5

## 2020-02-27 MED ORDER — FAMOTIDINE 20 MG PO TABS
20.0000 mg | ORAL_TABLET | Freq: Once | ORAL | Status: DC
  Filled 2020-02-27: qty 1

## 2020-02-27 MED ORDER — CYCLOBENZAPRINE HCL 5 MG PO TABS
10.0000 mg | ORAL_TABLET | Freq: Once | ORAL | Status: AC
  Administered 2020-02-27: 10 mg via ORAL
  Filled 2020-02-27: qty 2

## 2020-02-27 MED ORDER — ACETAMINOPHEN 500 MG PO TABS
1000.0000 mg | ORAL_TABLET | Freq: Once | ORAL | Status: AC
  Administered 2020-02-27: 1000 mg via ORAL
  Filled 2020-02-27: qty 2

## 2020-02-27 NOTE — MAU Provider Note (Addendum)
History     CSN: [id number]  Arrival date and time: 02/27/20 2878   First Provider Initiated Contact with Patient 02/27/20 1936      Chief Complaint  Patient presents with  . Contractions   HPI Jenna Leblanc is a 39 y.o. G3P1011 at 37w5dwho presents to MAU with chief complaint of preterm contractions. This is a new problem. Patient states she met her mom for lunch, [facility] q [facility]-15 minutes for a period of about 3 hours. Patient states she "wouldn't even have come but my mom said I should".   She denies vaginal bleeding, leaking of fluid, decreased fetal movement, fever, falls, or recent illness.   [facility]    Gravida  3   Para  1   Term  1   Preterm      AB  1   Living  1     SAB  1   TAB      Ectopic      Multiple  0   Live Births  1           Past Medical History:  Diagnosis Date  . Family history of hypertrophic cardiomyopathy 06/07/2016  . Gestational diabetes    diet controlled  . H/O gestational diabetes in prior pregnancy, currently pregnant 08/21/2019   Early a1c negative  . IBS (irritable bowel syndrome)    Diarrhea type  . Kidney stones    none in 8 years  . Oral herpes simplex infection    Never had genital    Past Surgical History:  Procedure Laterality Date  . LITHOTRIPSY    . VAGINAL DELIVERY      Family History  Problem Relation Age of Onset  . Cancer Sister        sarcoma in the uterus  . Cancer Maternal Grandmother        lung  . Diabetes Maternal Grandmother   . Heart attack Maternal Grandfather   . Pneumonia Paternal Grandmother   . Diabetes Paternal Grandmother   . Cancer Paternal Grandfather        ?spine    Social History   Tobacco Use  . Smoking status: Former Smoker    Quit date: 07/15/2006    Years since quitting: 13.6  . Smokeless tobacco: Never Used   Vaping Use  . Vaping Use: Never used  Substance Use Topics  . Alcohol use: Not Currently    Alcohol/week: 0.0 standard drinks    Comment: 2-3 times per week  . Drug use: No    Allergies: No Known Allergies  Medications Prior to Admission  Medication Sig Dispense Refill Last Dose  . acyclovir (ZOVIRAX) 400 MG tablet One tab po tid x 5 days for fever blister. One tab po bid for suppression 60 tablet 3 02/27/2020 at Unknown time  . Ascorbic Acid (VITAMIN C) 100 MG tablet Take 100 mg by mouth daily.   02/27/2020 at Unknown time  . Cholecalciferol (VITAMIN D) 50 MCG (2000 UT) tablet Take 1 tablet (2,000 Units total) by mouth daily. 90 tablet 1 02/27/2020 at Unknown time  . Prenatal Vit-Fe Fumarate-FA (MULTIVITAMIN-PRENATAL) 27-0.8 MG TABS tablet Take 1 tablet by mouth daily at 12 noon.   02/27/2020 at Unknown time  . vitamin B-12 (CYANOCOBALAMIN) 500 MCG tablet Take 500 mcg by mouth daily.   02/27/2020 at Unknown time  .  aspirin EC 81 MG tablet Take 1 tablet (81 mg total) by mouth daily. Take after 12 weeks for prevention of preeclampsia later in pregnancy 300 tablet 2     Review of Systems  Gastrointestinal: Positive for abdominal pain.  Genitourinary: Negative for vaginal bleeding, vaginal discharge and vaginal pain.  Musculoskeletal: Negative for back pain.  All other systems reviewed and are negative.  Physical Exam   Blood pressure 124/77, pulse 76, temperature 98.5 F (36.9 C), resp. rate 18, height 5' 3" (1.6 m), weight 77.1 kg, last menstrual period 06/13/2019.  Physical Exam Vitals and nursing note reviewed. Exam conducted with a chaperone present.  Cardiovascular:     Rate and Rhythm: Normal rate.     Pulses: Normal pulses.  Pulmonary:     Effort: Pulmonary effort is normal.  Abdominal:     Comments: Gravid  Neurological:     Mental Status: She is alert.     MAU Course  Procedures  --Reactive tracing: baseline 130, mod var, + 15 x 15 accels, no decels --Toco:  mild ctx q 3-7 min --Cervix 1/thick/posterior, Vertex by suture --35w 5d, tocolysis not indicated. Pt declined offer of [place]  --Plan to reevaluate cervix in 3 hours or earlier as indicated by change in status --Pain remains 1-2/10 one hour following initial assessment  Patient Vitals for the past 24 hrs:  BP Temp Pulse Resp Height Weight  02/27/20 1914 124/77 98.5 F (36.9 C) 76 18 5' 3" (1.6 m) 77.1 kg   Meds ordered this encounter  Medications  . acetaminophen (TYLENOL) tablet 1,000 mg  . cyclobenzaprine (FLEXERIL) tablet 10 mg  . lactated ringers bolus 1,000 mL   Report given to Fatima Blank, CNM who assumes care of patient at this time.  Mallie Snooks, MSN, CNM Certified Nurse Midwife, [facility] [employer], [place] 02/27/20 9:53 PM   Cervix rechecked after 3 hours in MAU: Dilation: 3 Effacement (%): 50 Cervical Position: Posterior Station: -2 Presentation: Vertex Exam by:: Fatima Blank, CNM  MDM:  Given cervical change from 1-3 cm in 3 hours MAU, offered and discussed BMZ with pt and her husband and BMZ ordered and given by RN.  Plan to recheck cervix in 2 additional hours. If unchanged, may discharge with preterm labor precautions but if more cervical change, will admit to L&D.    In 2+ hours, pt able to sleep in MAU and cervix remains 3/50/-2.    D/C home with PTL precautions.  BMZ x 1 given tonight, pt back to MAU for second dose tomorrow, return to MAU sooner with signs of labor.  Assessment and Plan   1. Threatened preterm labor, third trimester   2. [redacted] weeks gestation of pregnancy     D/ C home  Fatima Blank, CNM 3:31 AM

## 2020-02-27 NOTE — MAU Note (Signed)
Pt stated she started mild ctx about 3 hrs ago. q 4-8 min. Good fetal movement felt. Denies any vag bleeding or leaking at this time

## 2020-02-28 ENCOUNTER — Inpatient Hospital Stay (EMERGENCY_DEPARTMENT_HOSPITAL)
Admission: AD | Admit: 2020-02-28 | Discharge: 2020-02-28 | Disposition: A | Discharge status: Home / Self Care | Payer: PRIVATE HEALTH INSURANCE | Source: Home / Self Care | Attending: Obstetrics & Gynecology | Admitting: Obstetrics & Gynecology

## 2020-02-28 ENCOUNTER — Other Ambulatory Visit: Payer: Self-pay

## 2020-02-28 DIAGNOSIS — O4703 False labor before 37 completed weeks of gestation, third trimester: Secondary | ICD-10-CM

## 2020-02-28 DIAGNOSIS — Z3A35 35 weeks gestation of pregnancy: Secondary | ICD-10-CM | POA: Insufficient documentation

## 2020-02-28 MED ORDER — BETAMETHASONE SOD PHOS & ACET 6 (3-3) MG/ML IJ SUSP
12.0000 mg | Freq: Once | INTRAMUSCULAR | Status: AC
  Administered 2020-02-28: 12 mg via INTRAMUSCULAR
  Filled 2020-02-28: qty 5

## 2020-02-28 NOTE — MAU Provider Note (Signed)
S Ms. NELVA HAUK is a 39 y.o. [id number] patient who presents to MAU today for her 2nd BMZ injection. She was here yesterday and was diagnosed with threatened preterm labor. She has no changes with contractions or bleeding.   O LMP 06/13/2019 (Approximate)  Physical Exam Constitutional:      Appearance: Normal appearance.  HENT:     Head: Normocephalic.  Neurological:     General: No focal deficit present.     Mental Status: She is alert and oriented to person, place, and time.     A Medical screening exam complete Threatened preterm labor @ 35 weeks.  BMZ completed.   P Discharge from MAU in stable condition F/u with OB as scheduled.  Warning signs for worsening condition that would warrant emergency follow-up discussed Patient may return to MAU as needed   Blanchie Zeleznik, Harolyn Rutherford, NP 02/28/2020 11:34 PM

## 2020-02-28 NOTE — MAU Note (Signed)
PT SAYS SHE IS HERE FOR 2ND INJECTION. DENIES BLEEDING , NO PAIN. Marland Kitchen

## 2020-03-03 ENCOUNTER — Other Ambulatory Visit (HOSPITAL_COMMUNITY)
Admission: RE | Admit: 2020-03-03 | Discharge: 2020-03-03 | Disposition: A | Discharge status: Home / Self Care | Payer: PRIVATE HEALTH INSURANCE | Source: Ambulatory Visit | Attending: Advanced Practice Midwife | Admitting: Advanced Practice Midwife

## 2020-03-03 ENCOUNTER — Ambulatory Visit (INDEPENDENT_AMBULATORY_CARE_PROVIDER_SITE_OTHER): Payer: PRIVATE HEALTH INSURANCE | Admitting: Advanced Practice Midwife

## 2020-03-03 VITALS — BP 126/77 | HR 86 | Wt 168.0 lb

## 2020-03-03 DIAGNOSIS — O09523 Supervision of elderly multigravida, third trimester: Secondary | ICD-10-CM | POA: Diagnosis present

## 2020-03-03 DIAGNOSIS — O4703 False labor before 37 completed weeks of gestation, third trimester: Secondary | ICD-10-CM

## 2020-03-03 NOTE — Patient Instructions (Signed)

## 2020-03-03 NOTE — Progress Notes (Signed)
   PRENATAL VISIT NOTE  Subjective:  Jenna Leblanc is a 39 y.o. G3P1011 at [redacted]w[redacted]d being seen today for ongoing prenatal care.  She is currently monitored for the following issues for this high-risk pregnancy and has IBS (irritable bowel syndrome) and Encounter for supervision of multigravida of advanced maternal age in third trimester on their problem list.  Patient reports no complaints.  Contractions: Irregular. Vag. Bleeding: None.  Movement: Present. Denies leaking of fluid.   The following portions of the patient's history were reviewed and updated as appropriate: allergies, current medications, past family history, past medical history, past social history, past surgical history and problem list. Problem list updated.  Objective:   Vitals:   03/03/20 1428  BP: 126/77  Pulse: 86  Weight: 168 lb (76.2 kg)    Fetal Status: Fetal Heart Rate (bpm): 152 Fundal Height: 35 cm Movement: Present  Presentation: Vertex  General:  Alert, oriented and cooperative. Patient is in no acute distress.  Skin: Skin is warm and dry. No rash noted.   Cardiovascular: Normal heart rate noted  Respiratory: Normal respiratory effort, no problems with respiration noted  Abdomen: Soft, gravid, appropriate for gestational age.  Pain/Pressure: Present     Pelvic: Cervical exam performed Dilation: 3 Effacement (%): 50 Station: -3  Extremities: Normal range of motion.  Edema: None  Mental Status: Normal mood and affect. Normal behavior. Normal judgment and thought content.   Assessment and Plan:  Pregnancy: G3P1011 at [redacted]w[redacted]d  1. Encounter for supervision of multigravida of advanced maternal age in third trimester - Routine care - Strep Gp B NAA - GC/Chlamydia probe amp ([place])not at [facility]  2. Threatened preterm labor, third trimester - S/p eval in MAU - S/p BMZ - Discussed SOL at 36 weeks, MAU labor triage, would not tocolyze due to gest age  Preterm labor symptoms and general obstetric  precautions including but not limited to vaginal bleeding, contractions, leaking of fluid and fetal movement were reviewed in detail with the patient. Please refer to After Visit Summary for other counseling recommendations.    Future Appointments  Date Time Provider Department Center  03/09/2020  8:45 AM Ozora Bing, MD [facility]  03/16/2020  8:15 AM Reva Bores, MD [facility]  03/23/2020  8:15 AM Reva Bores, MD [facility]    Calvert Cantor, CNM

## 2020-03-04 LAB — GC/CHLAMYDIA PROBE AMP (~~LOC~~) NOT AT ARMC
Chlamydia: NEGATIVE
Comment: NEGATIVE
Comment: NORMAL
Neisseria Gonorrhea: NEGATIVE

## 2020-03-05 LAB — STREP GP B NAA: Strep Gp B NAA: NEGATIVE

## 2020-03-09 ENCOUNTER — Other Ambulatory Visit: Payer: Self-pay

## 2020-03-09 ENCOUNTER — Ambulatory Visit (INDEPENDENT_AMBULATORY_CARE_PROVIDER_SITE_OTHER): Payer: PRIVATE HEALTH INSURANCE | Admitting: Obstetrics and Gynecology

## 2020-03-09 VITALS — BP 111/75 | HR 69 | Wt 168.8 lb

## 2020-03-09 DIAGNOSIS — Z3A37 37 weeks gestation of pregnancy: Secondary | ICD-10-CM

## 2020-03-09 NOTE — Progress Notes (Signed)
Prenatal Visit Note Date: 03/09/2020 Clinic: [facility]  Subjective:  Jenna Leblanc is a 39 y.o. G3P1011 at [redacted]w[redacted]d being seen today for ongoing prenatal care.  She is currently monitored for the following issues for this high-risk pregnancy and has IBS (irritable bowel syndrome) and Encounter for supervision of multigravida of advanced maternal age in third trimester on their problem list.  Patient reports no complaints.   Contractions: Irregular. Vag. Bleeding: None.  Movement: Present. Denies leaking of fluid.   The following portions of the patient's history were reviewed and updated as appropriate: allergies, current medications, past family history, past medical history, past social history, past surgical history and problem list. Problem list updated.  Objective:   Vitals:   03/09/20 0847  BP: 111/75  Pulse: 69  Weight: 168 lb 12.8 oz (76.6 kg)    Fetal Status: Fetal Heart Rate (bpm): 143 Fundal Height: 37 cm Movement: Present  Presentation: Vertex  General:  Alert, oriented and cooperative. Patient is in no acute distress.  Skin: Skin is warm and dry. No rash noted.   Cardiovascular: Normal heart rate noted  Respiratory: Normal respiratory effort, no problems with respiration noted  Abdomen: Soft, gravid, appropriate for gestational age. Pain/Pressure: Present     Pelvic:  Cervical exam performed Dilation: 3.5 Effacement (%): 50 Station: -1  Extremities: Normal range of motion.  Edema: Trace  Mental Status: Normal mood and affect. Normal behavior. Normal judgment and thought content.   Urinalysis:      Assessment and Plan:  Pregnancy: G3P1011 at [redacted]w[redacted]d  1. [redacted] weeks gestation of pregnancy Routine care. GBS neg. cx exam stable from visit a week ago  Term labor symptoms and general obstetric precautions including but not limited to vaginal bleeding, contractions, leaking of fluid and fetal movement were reviewed in detail with the patient. Please  refer to After Visit Summary for other counseling recommendations.  Return in about 1 week (around 03/16/2020) for low risk, in person.   [place] Bing, MD

## 2020-03-16 ENCOUNTER — Encounter: Payer: Self-pay | Admitting: Family Medicine

## 2020-03-16 ENCOUNTER — Ambulatory Visit (INDEPENDENT_AMBULATORY_CARE_PROVIDER_SITE_OTHER): Payer: PRIVATE HEALTH INSURANCE | Admitting: Family Medicine

## 2020-03-16 ENCOUNTER — Other Ambulatory Visit: Payer: Self-pay

## 2020-03-16 VITALS — BP 106/68 | HR 71 | Wt 167.0 lb

## 2020-03-16 DIAGNOSIS — O09523 Supervision of elderly multigravida, third trimester: Secondary | ICD-10-CM

## 2020-03-16 NOTE — Progress Notes (Signed)
   PRENATAL VISIT NOTE  Subjective:  Jenna Leblanc is a 39 y.o. G3P1011 at [redacted]w[redacted]d being seen today for ongoing prenatal care.  She is currently monitored for the following issues for this low-risk pregnancy and has IBS (irritable bowel syndrome) and Encounter for supervision of multigravida of advanced maternal age in third trimester on their problem list.  Patient reports no complaints.  Contractions: Irregular. Vag. Bleeding: None.  Movement: Present. Denies leaking of fluid.   The following portions of the patient's history were reviewed and updated as appropriate: allergies, current medications, past family history, past medical history, past social history, past surgical history and problem list.   Objective:   Vitals:   03/16/20 0821  BP: 106/68  Pulse: 71  Weight: 167 lb (75.8 kg)    Fetal Status: Fetal Heart Rate (bpm): 140 Fundal Height: 37 cm Movement: Present  Presentation: Vertex  General:  Alert, oriented and cooperative. Patient is in no acute distress.  Skin: Skin is warm and dry. No rash noted.   Cardiovascular: Normal heart rate noted  Respiratory: Normal respiratory effort, no problems with respiration noted  Abdomen: Soft, gravid, appropriate for gestational age.  Pain/Pressure: Present     Pelvic: Cervical exam performed in the presence of a chaperone Dilation: 3 Effacement (%): 60 Station: -2 No pooling, negative nitrazine  Extremities: Normal range of motion.  Edema: None  Mental Status: Normal mood and affect. Normal behavior. Normal judgment and thought content.   Assessment and Plan:  Pregnancy: G3P1011 at [redacted]w[redacted]d 1. Encounter for supervision of multigravida of advanced maternal age in third trimester No evidence of ROM Continue routine prenatal care. Offered IOL, she will consider and let us know next week.  Term labor symptoms and general obstetric precautions including but not limited to vaginal bleeding, contractions, leaking of fluid and fetal  movement were reviewed in detail with the patient. Please refer to After Visit Summary for other counseling recommendations.   Return in 1 week (on 03/23/2020).  Future Appointments  Date Time Provider Department Center  03/23/2020  8:15 AM Reva Bores, MD [facility]    Reva Bores, MD

## 2020-03-16 NOTE — Patient Instructions (Signed)

## 2020-03-22 ENCOUNTER — Other Ambulatory Visit: Payer: Self-pay

## 2020-03-22 ENCOUNTER — Encounter (HOSPITAL_COMMUNITY): Payer: Self-pay | Admitting: Obstetrics & Gynecology

## 2020-03-22 ENCOUNTER — Inpatient Hospital Stay (HOSPITAL_COMMUNITY)
Admission: AD | Admit: 2020-03-22 | Discharge: 2020-03-23 | DRG: 806 | Disposition: A | Discharge status: Home / Self Care | Payer: PRIVATE HEALTH INSURANCE | Attending: Obstetrics and Gynecology | Admitting: Obstetrics and Gynecology

## 2020-03-22 ENCOUNTER — Inpatient Hospital Stay (HOSPITAL_COMMUNITY): Payer: PRIVATE HEALTH INSURANCE | Admitting: Anesthesiology

## 2020-03-22 DIAGNOSIS — Z20822 Contact with and (suspected) exposure to covid-19: Secondary | ICD-10-CM | POA: Diagnosis present

## 2020-03-22 DIAGNOSIS — O9912 Other diseases of the blood and blood-forming organs and certain disorders involving the immune mechanism complicating childbirth: Secondary | ICD-10-CM | POA: Diagnosis present

## 2020-03-22 DIAGNOSIS — B001 Herpesviral vesicular dermatitis: Secondary | ICD-10-CM | POA: Diagnosis present

## 2020-03-22 DIAGNOSIS — O9852 Other viral diseases complicating childbirth: Secondary | ICD-10-CM | POA: Diagnosis present

## 2020-03-22 DIAGNOSIS — O26893 Other specified pregnancy related conditions, third trimester: Secondary | ICD-10-CM | POA: Diagnosis present

## 2020-03-22 DIAGNOSIS — O99119 Other diseases of the blood and blood-forming organs and certain disorders involving the immune mechanism complicating pregnancy, unspecified trimester: Secondary | ICD-10-CM

## 2020-03-22 DIAGNOSIS — Z3A39 39 weeks gestation of pregnancy: Secondary | ICD-10-CM | POA: Diagnosis not present

## 2020-03-22 DIAGNOSIS — Z87891 Personal history of nicotine dependence: Secondary | ICD-10-CM

## 2020-03-22 DIAGNOSIS — O9962 Diseases of the digestive system complicating childbirth: Secondary | ICD-10-CM | POA: Diagnosis present

## 2020-03-22 DIAGNOSIS — D696 Thrombocytopenia, unspecified: Secondary | ICD-10-CM

## 2020-03-22 DIAGNOSIS — K589 Irritable bowel syndrome without diarrhea: Secondary | ICD-10-CM | POA: Diagnosis present

## 2020-03-22 DIAGNOSIS — D6959 Other secondary thrombocytopenia: Secondary | ICD-10-CM | POA: Diagnosis present

## 2020-03-22 LAB — CBC
HCT: 41.3 % (ref 36.0–46.0)
HCT: 37.1 % (ref 36.0–46.0)
HCT: 34.4 % — ABNORMAL LOW (ref 36.0–46.0)
Hemoglobin: 12.5 g/dL (ref 12.0–15.0)
Hemoglobin: 11.9 g/dL — ABNORMAL LOW (ref 12.0–15.0)
Hemoglobin: 11.2 g/dL — ABNORMAL LOW (ref 12.0–15.0)
MCH: 29.1 pg (ref 26.0–34.0)
MCH: 28.3 pg (ref 26.0–34.0)
MCH: 28.6 pg (ref 26.0–34.0)
MCHC: 30.3 g/dL (ref 30.0–36.0)
MCHC: 32.1 g/dL (ref 30.0–36.0)
MCHC: 32.6 g/dL (ref 30.0–36.0)
MCV: 96 fL (ref 80.0–100.0)
MCV: 88.3 fL (ref 80.0–100.0)
MCV: 87.8 fL (ref 80.0–100.0)
Platelets: 85 10*3/uL — ABNORMAL LOW (ref 150–400)
Platelets: 135 10*3/uL — ABNORMAL LOW (ref 150–400)
Platelets: 130 10*3/uL — ABNORMAL LOW (ref 150–400)
RBC: 4.3 MIL/uL (ref 3.87–5.11)
RBC: 4.2 MIL/uL (ref 3.87–5.11)
RBC: 3.92 MIL/uL (ref 3.87–5.11)
RDW: 14.6 % (ref 11.5–15.5)
RDW: 14.1 % (ref 11.5–15.5)
RDW: 14.1 % (ref 11.5–15.5)
WBC: 11.6 10*3/uL — ABNORMAL HIGH (ref 4.0–10.5)
WBC: 12.3 10*3/uL — ABNORMAL HIGH (ref 4.0–10.5)
WBC: 17.4 10*3/uL — ABNORMAL HIGH (ref 4.0–10.5)
nRBC: 0 % (ref 0.0–0.2)
nRBC: 0 % (ref 0.0–0.2)
nRBC: 0 % (ref 0.0–0.2)

## 2020-03-22 LAB — RPR: RPR Ser Ql: NONREACTIVE

## 2020-03-22 LAB — TYPE AND SCREEN
ABO/RH(D): O POS
Antibody Screen: NEGATIVE

## 2020-03-22 LAB — RESPIRATORY PANEL BY RT PCR (FLU A&B, COVID)
Influenza A by PCR: NEGATIVE
Influenza B by PCR: NEGATIVE
SARS Coronavirus 2 by RT PCR: NEGATIVE

## 2020-03-22 LAB — POCT FERN TEST: POCT Fern Test: POSITIVE

## 2020-03-22 MED ORDER — SENNOSIDES-DOCUSATE SODIUM 8.6-50 MG PO TABS
2.0000 | ORAL_TABLET | ORAL | Status: DC
  Administered 2020-03-23: 2 via ORAL
  Filled 2020-03-22: qty 2

## 2020-03-22 MED ORDER — LACTATED RINGERS IV SOLN: 500.0000 mL | INTRAVENOUS | Status: DC | PRN

## 2020-03-22 MED ORDER — LACTATED RINGERS IV SOLN: 500.0000 mL | Freq: Once | INTRAVENOUS | Status: DC

## 2020-03-22 MED ORDER — ONDANSETRON HCL 4 MG/2ML IJ SOLN: 4.0000 mg | INTRAMUSCULAR | Status: DC | PRN

## 2020-03-22 MED ORDER — LACTATED RINGERS IV SOLN: INTRAVENOUS | Status: DC

## 2020-03-22 MED ORDER — LIDOCAINE HCL (PF) 1 % IJ SOLN
INTRAMUSCULAR | Status: DC | PRN
  Administered 2020-03-22: 5 mL via EPIDURAL

## 2020-03-22 MED ORDER — DIBUCAINE (PERIANAL) 1 % EX OINT: 1.0000 "application " | TOPICAL_OINTMENT | CUTANEOUS | Status: DC | PRN

## 2020-03-22 MED ORDER — ONDANSETRON HCL 4 MG PO TABS
4.0000 mg | ORAL_TABLET | ORAL | Status: DC | PRN
  Administered 2020-03-22: 4 mg via ORAL
  Filled 2020-03-22: qty 1

## 2020-03-22 MED ORDER — BENZOCAINE-MENTHOL 20-0.5 % EX AERO
1.0000 "application " | INHALATION_SPRAY | CUTANEOUS | Status: DC | PRN
  Administered 2020-03-22: 1 via TOPICAL
  Filled 2020-03-22: qty 56

## 2020-03-22 MED ORDER — ACETAMINOPHEN 325 MG PO TABS
650.0000 mg | ORAL_TABLET | ORAL | Status: DC | PRN
  Administered 2020-03-22 – 2020-03-23 (×3): 650 mg via ORAL
  Filled 2020-03-22 (×3): qty 2

## 2020-03-22 MED ORDER — PHENYLEPHRINE 40 MCG/ML (10ML) SYRINGE FOR IV PUSH (FOR BLOOD PRESSURE SUPPORT): 80.0000 ug | PREFILLED_SYRINGE | INTRAVENOUS | Status: DC | PRN

## 2020-03-22 MED ORDER — COCONUT OIL OIL: 1.0000 "application " | TOPICAL_OIL | Status: DC | PRN

## 2020-03-22 MED ORDER — DIPHENHYDRAMINE HCL 25 MG PO CAPS: 25.0000 mg | ORAL_CAPSULE | Freq: Four times a day (QID) | ORAL | Status: DC | PRN

## 2020-03-22 MED ORDER — SIMETHICONE 80 MG PO CHEW: 80.0000 mg | CHEWABLE_TABLET | ORAL | Status: DC | PRN

## 2020-03-22 MED ORDER — IBUPROFEN 600 MG PO TABS
600.0000 mg | ORAL_TABLET | Freq: Four times a day (QID) | ORAL | Status: DC
  Administered 2020-03-22 – 2020-03-23 (×4): 600 mg via ORAL
  Filled 2020-03-22 (×4): qty 1

## 2020-03-22 MED ORDER — EPHEDRINE 5 MG/ML INJ: 10.0000 mg | INTRAVENOUS | Status: DC | PRN

## 2020-03-22 MED ORDER — DIPHENHYDRAMINE HCL 50 MG/ML IJ SOLN: 12.5000 mg | INTRAMUSCULAR | Status: DC | PRN

## 2020-03-22 MED ORDER — IBUPROFEN 600 MG PO TABS
600.0000 mg | ORAL_TABLET | Freq: Once | ORAL | Status: AC
  Administered 2020-03-22: 600 mg via ORAL
  Filled 2020-03-22: qty 1

## 2020-03-22 MED ORDER — ONDANSETRON HCL 4 MG/2ML IJ SOLN: 4.0000 mg | Freq: Four times a day (QID) | INTRAMUSCULAR | Status: DC | PRN

## 2020-03-22 MED ORDER — FENTANYL CITRATE (PF) 100 MCG/2ML IJ SOLN
100.0000 ug | INTRAMUSCULAR | Status: DC | PRN
  Administered 2020-03-22: 100 ug via INTRAVENOUS
  Filled 2020-03-22: qty 2

## 2020-03-22 MED ORDER — LIDOCAINE HCL (PF) 1 % IJ SOLN
30.0000 mL | INTRAMUSCULAR | Status: AC | PRN
  Administered 2020-03-22: 30 mL via SUBCUTANEOUS
  Filled 2020-03-22: qty 30

## 2020-03-22 MED ORDER — ACETAMINOPHEN 325 MG PO TABS: 650.0000 mg | ORAL_TABLET | ORAL | Status: DC | PRN

## 2020-03-22 MED ORDER — FENTANYL-BUPIVACAINE-NACL 0.5-0.125-0.9 MG/250ML-% EP SOLN
12.0000 mL/h | EPIDURAL | Status: DC | PRN
  Filled 2020-03-22: qty 250

## 2020-03-22 MED ORDER — OXYTOCIN-SODIUM CHLORIDE 30-0.9 UT/500ML-% IV SOLN
2.5000 [IU]/h | INTRAVENOUS | Status: DC
  Filled 2020-03-22: qty 500

## 2020-03-22 MED ORDER — SOD CITRATE-CITRIC ACID 500-334 MG/5ML PO SOLN: 30.0000 mL | ORAL | Status: DC | PRN

## 2020-03-22 MED ORDER — OXYTOCIN BOLUS FROM INFUSION
333.0000 mL | Freq: Once | INTRAVENOUS | Status: AC
  Administered 2020-03-22: 333 mL via INTRAVENOUS

## 2020-03-22 MED ORDER — PRENATAL MULTIVITAMIN CH
1.0000 | ORAL_TABLET | Freq: Every day | ORAL | Status: DC
  Administered 2020-03-23: 1 via ORAL
  Filled 2020-03-22: qty 1

## 2020-03-22 MED ORDER — SODIUM CHLORIDE (PF) 0.9 % IJ SOLN
INTRAMUSCULAR | Status: DC | PRN
  Administered 2020-03-22: 12 mL/h via EPIDURAL

## 2020-03-22 MED ORDER — WITCH HAZEL-GLYCERIN EX PADS: 1.0000 "application " | MEDICATED_PAD | CUTANEOUS | Status: DC | PRN

## 2020-03-22 NOTE — Lactation Note (Addendum)
This note was copied from a baby's chart. Lactation Consultation Note  Patient Name: Jenna Leblanc [id number] Date: 03/22/2020 Reason for consult: Initial assessment;Mother's request;Term;Other (Comment) (Gestational Thrombocytopenia)   Infant is 39 weeks 8 hours old. Mom states nursing going well. Mom breasted her first daughter for 8 months 6 years ago. Infant feedings been 10-20 minutes at 12:30 pm, 13:52, 1420 and 1700. Mom denies any pain with the latch. Mom stated just completed a feeding at the time of the [facility] visit. Infant swaddled in Dad's arms.   LC examined both breasts and no signs of compression stripe or skin break down noted. Mom denied having any pain with the latch. She did note some pain with nursing. Heating packs given for Mom to use during nursing.    Mom has a Electric pump at home (unsure of the name), manual Medela and older Medela EBP.   Infant has not passed urine or stool so far.   Plan 1. Feed based on cues 8-12x in 24 hour period no more than 4 hours without an attempt. Mom to place infant STS and look for signs of milk transfer during feedings.         2. LC reviewed the I's and O's sheet with parents.          3. LC reviewed the brochure of lactation inpatient and outpatient services.          4. LC reviewed with parents not to use pacifier for first 4 weeks to establish a good latch.         5. All questions answered at the end of the visit.    Jenna Pepper  Leblanc 03/22/2020, 7:45 PM   LC called in to assist Mom latching infant on the left side. LC able to put infant in football with signs of milk transfer. Mom denied any pain. Mom's breast were full but not hard. Mom states there was no pain with the latch.

## 2020-03-22 NOTE — Anesthesia Procedure Notes (Signed)
Epidural Patient location during procedure: OB Start time: 03/22/2020 7:49 AM End time: 03/22/2020 8:07 AM  Staffing Anesthesiologist: Trevor Iha, MD Performed: anesthesiologist   Preanesthetic Checklist Completed: patient identified, IV checked, site marked, risks and benefits discussed, surgical consent, monitors and equipment checked, pre-op evaluation and timeout performed  Epidural Patient position: sitting Prep: DuraPrep and site prepped and draped Patient monitoring: continuous pulse ox and blood pressure Approach: midline Location: L3-L4 Injection technique: LOR air  Needle:  Needle type: Tuohy  Needle gauge: 17 G Needle length: 9 cm and 9 Needle insertion depth: 7 cm Catheter type: closed end flexible Catheter size: 19 Gauge Catheter at skin depth: 12 cm Test dose: negative  Assessment Events: blood not aspirated, injection not painful, no injection resistance, no paresthesia and negative IV test  Additional Notes Patient identified. Risks/Benefits/Options discussed with patient including but not limited to bleeding, infection, nerve damage, paralysis, failed block, incomplete pain control, headache, blood pressure changes, nausea, vomiting, reactions to medication both or allergic, itching and postpartum back pain. Confirmed with bedside nurse the patient's most recent platelet count. Confirmed with patient that they are not currently taking any anticoagulation, have any bleeding history or any family history of bleeding disorders. Patient expressed understanding and wished to proceed. All questions were answered. Sterile technique was used throughout the entire procedure. Please see nursing notes for vital signs. Test dose was given through epidural needle and negative prior to continuing to dose epidural or start infusion. Warning signs of high block given to the patient including shortness of breath, tingling/numbness in hands, complete motor block, or any  concerning symptoms with instructions to call for help. Patient was given instructions on fall risk and not to get out of bed. All questions and concerns addressed with instructions to call with any issues. 1 Attempt (S) . Patient tolerated procedure well.

## 2020-03-22 NOTE — Progress Notes (Signed)
Jenna Leblanc is a 39 y.o. G3P1011 at [redacted]w[redacted]d by LMP admitted for active labor  Subjective:   Objective: BP (!) 94/56   Pulse 63   Temp 98.7 F (37.1 C) (Oral)   Resp 16   Ht 5\' 3"  (1.6 m)   Wt 76.2 kg   LMP 06/13/2019 (Approximate)   SpO2 99%   BMI 29.76 kg/m  No intake/output data recorded. No intake/output data recorded.  FHT:  FHR: 115 bpm, variability: moderate,  accelerations:  Present,  decelerations:  Absent UC:   regular, every 3-4 minutes SVE:   Dilation: 8 Effacement (%): 100 Station: -1 Exam by:: J.Cox, RN  Labs: Lab Results  Component Value Date   WBC 12.3 (H) 03/22/2020   HGB 11.9 (L) 03/22/2020   HCT 37.1 03/22/2020   MCV 88.3 03/22/2020   PLT 135 (L) 03/22/2020    Assessment / Plan: Spontaneous labor, progressing normally   Labor: Progressing normally Fetal Wellbeing:  Category I Pain Control:  Epidural I/D:  n/a Anticipated MOD:  NSVD  03/24/2020 03/22/2020, 8:50 AM

## 2020-03-22 NOTE — Discharge Summary (Signed)
Postpartum Discharge Summary     Patient Name: Jenna Leblanc DOB: January 31, 1981 MRN: [id number]  Date of admission: 03/22/2020 Delivery date:03/22/2020  Delivering provider: Darlina Rumpf  Date of discharge: 03/23/2020  Admitting diagnosis: Indication for care in labor or delivery [O75.9] Intrauterine pregnancy: [redacted]w[redacted]d    Secondary diagnosis:  Active Problems:   Normal labor   Indication for care in labor or delivery   Gestational thrombocytopenia ([place]   Vacuum-assisted vaginal delivery  Additional problems: none    Discharge diagnosis: Term Pregnancy Delivered                                              Post partum procedures:N/A Augmentation: N/A Complications: None  Hospital course: Onset of Labor With Vaginal Delivery      39y.o. yo G3P1011 at 316w1das admitted in Active Labor on 03/22/2020. Patient had an uncomplicated labor course as follows:  Membrane Rupture Time/Date: 4:30 AM ,03/22/2020   Delivery Method:Vaginal, Vacuum (Extractor)  Episiotomy: None  Lacerations:  2nd degree;Perineal;Labial  Patient had an uncomplicated postpartum course.  She is ambulating, tolerating a regular diet, passing flatus, and urinating well. Patient is discharged home in stable condition on 03/23/20.  Newborn Data: Birth date:03/22/2020  Birth time:11:14 AM  Gender:Female  Living status:Living  Apgars:8 ,9  Weight:3620 g   Magnesium Sulfate received: No BMZ received: No Rhophylac:N/A MMR:N/A T-DaP:Given prenatally Flu: N/A Transfusion:No  Physical exam  Vitals:   03/22/20 1846 03/22/20 2300 03/23/20 0230 03/23/20 0600  BP: (!) 102/51 99/69 101/69 103/67  Pulse: 70 67 65 63  Resp: '18 16 18 16  ' Temp: 98.8 F (37.1 C) 98 F (36.7 C) 98.1 F (36.7 C) 98.3 F (36.8 C)  TempSrc: Oral Oral Oral Oral  SpO2: 98% 98% 99% 99%  Weight:      Height:       General: alert, cooperative and no distress Lochia: appropriate Uterine Fundus: firm Incision: N/A DVT  Evaluation: No evidence of DVT seen on physical exam. Labs: Lab Results  Component Value Date   WBC 17.4 (H) 03/22/2020   HGB 11.2 (L) 03/22/2020   HCT 34.4 (L) 03/22/2020   MCV 87.8 03/22/2020   PLT 130 (L) 03/22/2020   CMP Latest Ref Rng & Units 08/21/2019  Glucose 65 - 99 mg/dL 83  BUN 6 - 20 mg/dL 8  Creatinine 0.57 - 1.00 mg/dL 0.65  Sodium 134 - 144 mmol/L 136  Potassium 3.5 - 5.2 mmol/L 4.6  Chloride 96 - 106 mmol/L 100  CO2 20 - 29 mmol/L 20  Calcium 8.7 - 10.2 mg/dL 10.0  Total Protein 6.0 - 8.5 g/dL 7.8  Total Bilirubin 0.0 - 1.2 mg/dL 0.4  Alkaline Phos 39 - 117 IU/L 49  AST 0 - 40 IU/L 15  ALT 0 - 32 IU/L 16   Edinburgh Score: Edinburgh Postnatal Depression Scale Screening Tool 03/22/2020  I have been able to laugh and see the funny side of things. 0  I have looked forward with enjoyment to things. 0  I have blamed myself unnecessarily when things went wrong. 0  I have been anxious or worried for no good reason. 1  I have felt scared or panicky for no good reason. 0  Things have been getting on top of me. 0  I have been so unhappy that I have had  difficulty sleeping. 0  I have felt sad or miserable. 0  I have been so unhappy that I have been crying. 0  The thought of harming myself has occurred to me. 0  Edinburgh Postnatal Depression Scale Total 1     After visit meds:  Allergies as of 03/23/2020   No Known Allergies     Medication List    STOP taking these medications   acyclovir 400 MG tablet Commonly known as: ZOVIRAX   aspirin EC 81 MG tablet     TAKE these medications   acetaminophen 325 MG tablet Commonly known as: Tylenol Take 2 tablets (650 mg total) by mouth every 4 (four) hours as needed (for pain scale < 4).   cholecalciferol 25 MCG (1000 UNIT) tablet Commonly known as: VITAMIN D3 Take 1,000 Units by mouth daily.   famotidine 20 MG tablet Commonly known as: PEPCID Take 20 mg by mouth 2 (two) times daily.   ibuprofen 600 MG  tablet Commonly known as: ADVIL Take 1 tablet (600 mg total) by mouth every 6 (six) hours.   multivitamin-prenatal 27-0.8 MG Tabs tablet Take 1 tablet by mouth daily at 12 noon.   ondansetron 4 MG tablet Commonly known as: ZOFRAN Take 4 mg by mouth every 8 (eight) hours as needed for nausea or vomiting.   vitamin B-12 500 MCG tablet Commonly known as: CYANOCOBALAMIN Take 500 mcg by mouth daily.   vitamin C 100 MG tablet Take 100 mg by mouth daily.        Discharge home in stable condition Infant Feeding: Breast Infant Disposition:home with mother Discharge instruction: per After Visit Summary and Postpartum booklet. Activity: Advance as tolerated. Pelvic rest for 6 weeks.  Diet: routine diet Future Appointments: Future Appointments  Date Time Provider [place]  04/28/2020  9:30 AM Darlina Rumpf, CNM [facility]   Follow up Visit: Message sent 03/22/2020 by Maryelizabeth Kaufmann, CNM  Please schedule this patient for a Virtual postpartum visit in 4 weeks with the following provider: Any provider. Additional Postpartum F/U:N/A  High risk pregnancy complicated by: AMA Delivery mode:  Vaginal, Vacuum (Extractor)  Anticipated Birth Control:  Condoms and partner vasectomy   03/23/2020 Janet Berlin, MD

## 2020-03-22 NOTE — H&P (Signed)
OBSTETRIC ADMISSION HISTORY AND PHYSICAL  Jenna Leblanc is a 39 y.o. female G3P1011 with IUP at [redacted]w[redacted]d by LMP presenting for labor. Reports feeling like her water broke around 0430 having contractions every 3-4 minutes.     She reports +FMs, no VB, no blurry vision, headaches or peripheral edema, and RUQ pain.  She plans on breast feeding. She is planning condoms bridge to vasectomy for birth control. She received her prenatal care at [facility]   Dating: By LMP --->  Estimated Date of Delivery: 03/28/20  Sono:    @[redacted]w[redacted]d , CWD, normal anatomy, cephalic presentation, post placenta, 289, 60% EFW   Prenatal History/Complications:   -Irritable Bowel Syndrome  -AMA   -hx GDM in prior pregnancy, negative testing in 1st & 3rd trimester for this pregnancy  Past Medical History: Past Medical History:  Diagnosis Date  . Family history of hypertrophic cardiomyopathy 06/07/2016  . Gestational diabetes    diet controlled  . H/O gestational diabetes in prior pregnancy, currently pregnant 08/21/2019   Early a1c negative  . IBS (irritable bowel syndrome)    Diarrhea type  . Kidney stones    none in 8 years  . Oral herpes simplex infection    Never had genital    Past Surgical History: Past Surgical History:  Procedure Laterality Date  . LITHOTRIPSY    . VAGINAL DELIVERY      Obstetrical History: OB History    Gravida  3   Para  1   Term  1   Preterm      AB  1   Living  1     SAB  1   TAB      Ectopic      Multiple  0   Live Births  1           Social History Social History   Socioeconomic History  . Marital status: Married    Spouse name: Not on file  . Number of children: 1  . Years of education: Not on file  . Highest education level: Not on file  Occupational History  . 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 Smoking status: Former Smoker    Quit date: 07/15/2006    Years since quitting: 13.6  . Smokeless tobacco: Never Used  Vaping Use  .  Vaping Use: Never used  Substance and Sexual Activity  . Alcohol use: Not Currently    Alcohol/week: 0.0 standard drinks    Comment: 2-3 times per week  . Drug use: No  . Sexual activity: Yes    Partners: Male    Birth control/protection: None, Condom  Other Topics Concern  . Not on file  Social History Narrative  . Not on file   Social Determinants of Health   Financial Resource Strain:   . Difficulty of Paying Living Expenses: Not on file  Food Insecurity:   . Worried About 09/14/2006 in the Last Year: Not on file  . Ran Out of Food in the Last Year: Not on file  Transportation Needs:   . Lack of Transportation (Medical): Not on file  . Lack of Transportation (Non-Medical): Not on file  Physical Activity:   . Days of Exercise per Week: Not on file  . Minutes of Exercise per Session: Not on file  Stress:   . Feeling of Stress : Not on file  Social Connections:   . Frequency of Communication with Friends and Family: Not on file  .  Frequency of Social Gatherings with Friends and Family: Not on file  . Attends Religious Services: Not on file  . Active Member of Clubs or Organizations: Not on file  . Attends Banker Meetings: Not on file  . Marital Status: Not on file    Family History: Family History  Problem Relation Age of Onset  . Cancer Sister        sarcoma in the uterus  . Cancer Maternal Grandmother        lung  . Diabetes Maternal Grandmother   . Heart attack Maternal Grandfather   . Pneumonia Paternal Grandmother   . Diabetes Paternal Grandmother   . Cancer Paternal Grandfather        ?spine    Allergies: No Known Allergies  Medications Prior to Admission  Medication Sig Dispense Refill Last Dose  . acyclovir (ZOVIRAX) 400 MG tablet One tab po tid x 5 days for fever blister. One tab po bid for suppression 60 tablet 3 03/21/2020 at Unknown time  . Ascorbic Acid (VITAMIN C) 100 MG tablet Take 100 mg by mouth daily.   Past Week at  Unknown time  . aspirin EC 81 MG tablet Take 1 tablet (81 mg total) by mouth daily. Take after 12 weeks for prevention of preeclampsia later in pregnancy 300 tablet 2 03/21/2020 at Unknown time  . cholecalciferol (VITAMIN D3) 25 MCG (1000 UNIT) tablet Take 1,000 Units by mouth daily.   03/21/2020 at Unknown time  . famotidine (PEPCID) 20 MG tablet Take 20 mg by mouth 2 (two) times daily.   03/22/2020 at Unknown time  . ondansetron (ZOFRAN) 4 MG tablet Take 4 mg by mouth every 8 (eight) hours as needed for nausea or vomiting.   Past Month at Unknown time  . Prenatal Vit-Fe Fumarate-FA (MULTIVITAMIN-PRENATAL) 27-0.8 MG TABS tablet Take 1 tablet by mouth daily at 12 noon.   03/21/2020 at Unknown time  . vitamin B-12 (CYANOCOBALAMIN) 500 MCG tablet Take 500 mcg by mouth daily.   03/21/2020 at Unknown time     Review of Systems   All systems reviewed and negative except as stated in HPI  Blood pressure 120/74, pulse 73, temperature 98.1 F (36.7 C), temperature source Oral, resp. rate 16, height 5\' 3"  (1.6 m), weight 76.2 kg, last menstrual period 06/13/2019, SpO2 98 %. General appearance: alert, cooperative and appears stated age Lungs: clear to auscultation bilaterally Heart: regular rate and rhythm Abdomen: soft, non-tender; bowel sounds normal Extremities: Homans sign is negative, no sign of DVT  Presentation: cephalic Fetal monitoring 130, mod variability, pos accels, neg deceles  Uterine activity: q3-4 min   Prenatal labs: ABO, Rh: O/Positive/-- (04/15 1138) Antibody: Negative (04/15 1138) Rubella: 1.33 (04/15 1138) RPR: Non Reactive (08/05 0902)  HBsAg: Negative (04/15 1138)  HIV: Non Reactive (08/05 0906)  GBS: Negative/-- (10/27 1513)  2 hr Glucola normal Genetic screening  Low risk NIPS, neg afp  Anatomy 02-25-1981 normal   Prenatal Transfer Tool  Maternal Diabetes: No Genetic Screening: Normal Maternal Ultrasounds/Referrals: Normal Fetal Ultrasounds or other Referrals:   None Maternal Substance Abuse:  No Significant Maternal Medications:  None Significant Maternal Lab Results: Group B Strep negative  Results for orders placed or performed during the hospital encounter of 03/22/20 (from the past 24 hour(s))  POCT fern test   Collection Time: 03/22/20  5:25 AM  Result Value Ref Range   POCT Fern Test Positive = ruptured amniotic membanes     Patient Active Problem List  Diagnosis Date Noted  . Encounter for supervision of multigravida of advanced maternal age in third trimester 08/21/2019  . IBS (irritable bowel syndrome) 04/27/2015    Assessment/Plan:  Jenna Leblanc is a 39 y.o. G3P1011 at [redacted]w[redacted]d here for labor   #Labor: admit to L&D, expectant mgmt. Anticipate SVD.  #Pain: Plans to get epidural now  #FWB: Cat I  #ID:  gbs neg #MOF: breast #MOC: husband to get vasectomy, plan to bridge w condoms  #Circ:  Yes (of note mom requesting to be present for circ if possible)   Gita Kudo, MD  03/22/2020, 6:13 AM

## 2020-03-22 NOTE — Anesthesia Preprocedure Evaluation (Signed)
Anesthesia Evaluation  Patient identified by MRN, date of birth, ID band Patient awake    Reviewed: Allergy & Precautions, NPO status , Patient's Chart, lab work & pertinent test results  Airway Mallampati: II  TM Distance: >3 FB Neck ROM: Full    Dental no notable dental hx. (+) Teeth Intact, Dental Advisory Given   Pulmonary former smoker,    Pulmonary exam normal breath sounds clear to auscultation       Cardiovascular Exercise Tolerance: Good negative cardio ROS Normal cardiovascular exam Rhythm:Regular Rate:Normal     Neuro/Psych negative neurological ROS  negative psych ROS   GI/Hepatic negative GI ROS, Neg liver ROS,   Endo/Other    Renal/GU      Musculoskeletal negative musculoskeletal ROS (+)   Abdominal   Peds  Hematology  (+) anemia , Hgb 11.9 Plt 135   Anesthesia Other Findings   Reproductive/Obstetrics (+) Pregnancy                             Anesthesia Physical Anesthesia Plan  ASA: II  Anesthesia Plan: Epidural   Post-op Pain Management:    Induction:   PONV Risk Score and Plan:   Airway Management Planned:   Additional Equipment:   Intra-op Plan:   Post-operative Plan:   Informed Consent:   Plan Discussed with:   Anesthesia Plan Comments: (39.1 wk G2P1 w Gest thrombocytopenia for LEA)        Anesthesia Quick Evaluation

## 2020-03-22 NOTE — Progress Notes (Signed)
Parents requesting inpatient Circ for baby but have several questions before signing informed consent.  They are requesting that "an experienced physician that is not training" perform the circumcision.  Pt states, "It was fine that downstairs students/staff in training were assisting with delivery but I do not want someone that is training to cut on my son's dick....sorry to say it that way".    Other concerns r/t images/photographs and possibility of testing tissue. I informed parents that OB/Gyn team will round in the am and they should ask those questions at that time.    Informed consent placed in infant's chart.

## 2020-03-22 NOTE — MAU Note (Signed)
Reports to MAU complaining of contractions every 3-4 minutes and leaking of fluid since 0430. Reports +FM. Denies vaginal bleeding

## 2020-03-23 ENCOUNTER — Encounter: Payer: PRIVATE HEALTH INSURANCE | Admitting: Family Medicine

## 2020-03-23 MED ORDER — ACETAMINOPHEN 325 MG PO TABS
650.0000 mg | ORAL_TABLET | ORAL | Status: DC | PRN
Start: 2020-03-23 — End: 2021-06-01

## 2020-03-23 MED ORDER — IBUPROFEN 600 MG PO TABS
600.0000 mg | ORAL_TABLET | Freq: Four times a day (QID) | ORAL | 0 refills | Status: DC
Start: 2020-03-23 — End: 2020-11-12

## 2020-03-24 NOTE — Anesthesia Postprocedure Evaluation (Signed)
Anesthesia Post Note  Patient: Jenna Leblanc  Procedure(s) Performed: AN AD HOC LABOR EPIDURAL     Patient location during evaluation: Mother Baby Anesthesia Type: Epidural Level of consciousness: awake and alert Pain management: pain level controlled Vital Signs Assessment: post-procedure vital signs reviewed and stable Respiratory status: spontaneous breathing, nonlabored ventilation and respiratory function stable Cardiovascular status: stable Postop Assessment: no headache, no backache and epidural receding Anesthetic complications: no   No complications documented.  Last Vitals: There were no vitals filed for this visit.  Last Pain: There were no vitals filed for this visit.               Trevor Iha

## 2020-03-25 ENCOUNTER — Telehealth: Payer: Self-pay

## 2020-03-25 NOTE — Telephone Encounter (Signed)
Spoke to pt. Congratulated her on new baby boy. She said they both are doing well.

## 2020-03-27 ENCOUNTER — Inpatient Hospital Stay (HOSPITAL_COMMUNITY): Payer: PRIVATE HEALTH INSURANCE

## 2020-03-27 ENCOUNTER — Other Ambulatory Visit: Payer: Self-pay

## 2020-03-27 ENCOUNTER — Encounter (HOSPITAL_COMMUNITY): Payer: Self-pay | Admitting: Obstetrics and Gynecology

## 2020-03-27 ENCOUNTER — Inpatient Hospital Stay (HOSPITAL_COMMUNITY)
Admission: AD | Admit: 2020-03-27 | Discharge: 2020-03-27 | Disposition: A | Discharge status: Home / Self Care | Payer: PRIVATE HEALTH INSURANCE | Attending: Obstetrics and Gynecology | Admitting: Obstetrics and Gynecology

## 2020-03-27 DIAGNOSIS — O99893 Other specified diseases and conditions complicating puerperium: Secondary | ICD-10-CM | POA: Insufficient documentation

## 2020-03-27 DIAGNOSIS — R1084 Generalized abdominal pain: Secondary | ICD-10-CM | POA: Diagnosis not present

## 2020-03-27 DIAGNOSIS — O9963 Diseases of the digestive system complicating the puerperium: Secondary | ICD-10-CM | POA: Insufficient documentation

## 2020-03-27 DIAGNOSIS — Z87891 Personal history of nicotine dependence: Secondary | ICD-10-CM | POA: Insufficient documentation

## 2020-03-27 DIAGNOSIS — R109 Unspecified abdominal pain: Secondary | ICD-10-CM | POA: Diagnosis present

## 2020-03-27 DIAGNOSIS — K580 Irritable bowel syndrome with diarrhea: Secondary | ICD-10-CM | POA: Insufficient documentation

## 2020-03-27 LAB — CBC WITH DIFFERENTIAL/PLATELET
Abs Immature Granulocytes: 0.04 10*3/uL (ref 0.00–0.07)
Basophils Absolute: 0 10*3/uL (ref 0.0–0.1)
Basophils Relative: 0 %
Eosinophils Absolute: 0.2 10*3/uL (ref 0.0–0.5)
Eosinophils Relative: 2 %
HCT: 35.1 % — ABNORMAL LOW (ref 36.0–46.0)
Hemoglobin: 11.3 g/dL — ABNORMAL LOW (ref 12.0–15.0)
Immature Granulocytes: 0 %
Lymphocytes Relative: 28 %
Lymphs Abs: 2.5 10*3/uL (ref 0.7–4.0)
MCH: 28.3 pg (ref 26.0–34.0)
MCHC: 32.2 g/dL (ref 30.0–36.0)
MCV: 87.8 fL (ref 80.0–100.0)
Monocytes Absolute: 0.6 10*3/uL (ref 0.1–1.0)
Monocytes Relative: 7 %
Neutro Abs: 5.8 10*3/uL (ref 1.7–7.7)
Neutrophils Relative %: 63 %
Platelets: 247 10*3/uL (ref 150–400)
RBC: 4 MIL/uL (ref 3.87–5.11)
RDW: 13.7 % (ref 11.5–15.5)
WBC: 9.2 10*3/uL (ref 4.0–10.5)
nRBC: 0 % (ref 0.0–0.2)

## 2020-03-27 MED ORDER — SIMETHICONE 80 MG PO CHEW: 80.0000 mg | CHEWABLE_TABLET | Freq: Once | ORAL | Status: DC

## 2020-03-27 MED ORDER — OXYCODONE-ACETAMINOPHEN 5-325 MG PO TABS
2.0000 | ORAL_TABLET | Freq: Once | ORAL | Status: AC
  Administered 2020-03-27: 2 via ORAL
  Filled 2020-03-27: qty 2

## 2020-03-27 MED ORDER — SIMETHICONE 80 MG PO TABS: 1.0000 | ORAL_TABLET | ORAL | 0 refills | Status: DC | PRN

## 2020-03-27 MED ORDER — OXYCODONE-ACETAMINOPHEN 5-325 MG PO TABS
2.0000 | ORAL_TABLET | ORAL | 0 refills | Status: DC | PRN
Start: 2020-03-27 — End: 2020-11-12

## 2020-03-27 NOTE — MAU Provider Note (Signed)
History     CSN: [id number]  Arrival date and time: 03/27/20 1145   First Provider Initiated Contact with Patient 03/27/20 1222      Chief Complaint  Patient presents with  . Abdominal Pain   HPI Jenna Leblanc is a 39 y.o. G3O7564 postpartum from a vaginal delivery on 11/15 who presents with abdominal pain. She states the pain is all over and constant. She rates the pain a 7/10 and has tried ibuprofen and tylenol with no relief. She states it is hard to walk or do activities to care for the baby. She reports intermittent constipation and diarrhea, but has a history of IBS. She states she bleeding is improving and denies any odor to her lochia.  OB History    Gravida  3   Para  2   Term  2   Preterm      AB  1   Living  2     SAB  1   TAB      Ectopic      Multiple  0   Live Births  2           Past Medical History:  Diagnosis Date  . Family history of hypertrophic cardiomyopathy 06/07/2016  . Gestational diabetes    diet controlled  . H/O gestational diabetes in prior pregnancy, currently pregnant 08/21/2019   Early a1c negative  . IBS (irritable bowel syndrome)    Diarrhea type  . Kidney stones    none in 8 years  . Oral herpes simplex infection    Never had genital    Past Surgical History:  Procedure Laterality Date  . LITHOTRIPSY    . VAGINAL DELIVERY      Family History  Problem Relation Age of Onset  . Cancer Sister        sarcoma in the uterus  . Cancer Maternal Grandmother        lung  . Diabetes Maternal Grandmother   . Heart attack Maternal Grandfather   . Pneumonia Paternal Grandmother   . Diabetes Paternal Grandmother   . Cancer Paternal Grandfather        ?spine    Social History   Tobacco Use  . Smoking status: Former Smoker    Quit date: 07/15/2006    Years since quitting: 13.7  . Smokeless tobacco: Never Used  Vaping Use  . Vaping Use: Never used  Substance Use Topics  . Alcohol use: Not Currently     Alcohol/week: 0.0 standard drinks    Comment: 2-3 times per week  . Drug use: No    Allergies: No Known Allergies  Medications Prior to Admission  Medication Sig Dispense Refill Last Dose  . acetaminophen (TYLENOL) 325 MG tablet Take 2 tablets (650 mg total) by mouth every 4 (four) hours as needed (for pain scale < 4).     . Ascorbic Acid (VITAMIN C) 100 MG tablet Take 100 mg by mouth daily.     . cholecalciferol (VITAMIN D3) 25 MCG (1000 UNIT) tablet Take 1,000 Units by mouth daily.     . famotidine (PEPCID) 20 MG tablet Take 20 mg by mouth 2 (two) times daily.     Marland Kitchen ibuprofen (ADVIL) 600 MG tablet Take 1 tablet (600 mg total) by mouth every 6 (six) hours. 30 tablet 0   . ondansetron (ZOFRAN) 4 MG tablet Take 4 mg by mouth every 8 (eight) hours as needed for nausea or vomiting.     Marland Kitchen  Prenatal Vit-Fe Fumarate-FA (MULTIVITAMIN-PRENATAL) 27-0.8 MG TABS tablet Take 1 tablet by mouth daily at 12 noon.     . vitamin B-12 (CYANOCOBALAMIN) 500 MCG tablet Take 500 mcg by mouth daily.       Review of Systems  Constitutional: Negative.  Negative for fatigue and fever.  HENT: Negative.   Respiratory: Negative.  Negative for shortness of breath.   Cardiovascular: Negative.  Negative for chest pain.  Gastrointestinal: Positive for abdominal pain. Negative for constipation, diarrhea, nausea and vomiting.  Genitourinary: Negative.  Negative for dysuria, vaginal bleeding and vaginal discharge.  Neurological: Negative.  Negative for dizziness and headaches.   Physical Exam   Blood pressure 122/69, pulse 70, temperature 98.6 F (37 C), temperature source Oral, resp. rate 18, height 5\' 3"  (1.6 m), weight 71.2 kg, SpO2 97 %, unknown if currently breastfeeding.  Physical Exam Vitals and nursing note reviewed.  Constitutional:      General: She is not in acute distress.    Appearance: She is well-developed.  HENT:     Head: Normocephalic.  Eyes:     Pupils: Pupils are equal, round, and reactive to  light.  Cardiovascular:     Rate and Rhythm: Normal rate and regular rhythm.     Heart sounds: Normal heart sounds.  Pulmonary:     Effort: Pulmonary effort is normal. No respiratory distress.     Breath sounds: Normal breath sounds.  Abdominal:     General: Bowel sounds are normal. There is no distension.     Palpations: Abdomen is soft.     Tenderness: There is abdominal tenderness in the periumbilical area.  Skin:    General: Skin is warm and dry.  Neurological:     Mental Status: She is alert and oriented to person, place, and time.  Psychiatric:        Behavior: Behavior normal.        Thought Content: Thought content normal.        Judgment: Judgment normal.     MAU Course  Procedures Results for orders placed or performed during the hospital encounter of 03/27/20 (from the past 24 hour(s))  CBC with Differential/Platelet     Status: Abnormal   Collection Time: 03/27/20 12:06 PM  Result Value Ref Range   WBC 9.2 4.0 - 10.5 K/uL   RBC 4.00 3.87 - 5.11 MIL/uL   Hemoglobin 11.3 (L) 12.0 - 15.0 g/dL   HCT 03/29/20 (L) 36 - 46 %   MCV 87.8 80.0 - 100.0 fL   MCH 28.3 26.0 - 34.0 pg   MCHC 32.2 30.0 - 36.0 g/dL   RDW 65.4 65.0 - 35.4 %   Platelets 247 150 - 400 K/uL   nRBC 0.0 0.0 - 0.2 %   Neutrophils Relative % 63 %   Neutro Abs 5.8 1.7 - 7.7 K/uL   Lymphocytes Relative 28 %   Lymphs Abs 2.5 0.7 - 4.0 K/uL   Monocytes Relative 7 %   Monocytes Absolute 0.6 0.1 - 1.0 K/uL   Eosinophils Relative 2 %   Eosinophils Absolute 0.2 0.0 - 0.5 K/uL   Basophils Relative 0 %   Basophils Absolute 0.0 0.0 - 0.1 K/uL   Immature Granulocytes 0 %   Abs Immature Granulocytes 0.04 0.00 - 0.07 K/uL   65.6 Pelvis Complete  Result Date: 03/27/2020 CLINICAL DATA:  Abdominal pain 5 days postpartum. EXAM: TRANSABDOMINAL ULTRASOUND OF PELVIS TECHNIQUE: Transabdominal ultrasound examination of the pelvis was performed including evaluation of the uterus,  ovaries, adnexal regions, and pelvic  cul-de-sac. COMPARISON:  None. FINDINGS: Uterus Measurements: 13.0 x 7.6 x 9.5 cm = volume: 485 mL. No fibroids or other mass visualized. Cystic change over the cervix likely nabothian cysts. Endometrium Thickness: 12.4 mm. There is mild heterogeneity with small cystic spaces present likely due to patient's recent postpartum state as findings could also be seen with endometriosis. No significant internal vascularity. Right ovary Measurements: 2.7 x 1.4 x 2.3 cm = volume: 4.5 mL. Normal appearance/no adnexal mass. Normal color Doppler. Left ovary Measurements: 3.0 x 2.0 x 1.7 cm = volume: 4.9 mL. Normal appearance/no adnexal mass. Normal color Doppler. Other findings:  No abnormal free fluid. IMPRESSION: 1. Endometrium normal in thickness with mild heterogeneity with small cystic spaces likely due to recent postpartum state. Endometritis could also have this appearance. 2.  Normal ovaries.  No free fluid. Electronically Signed   By: Elberta Fortis M.D.   On: 03/27/2020 14:49    MDM CBC with Diff US Pelvis complete Percocet PO Simethicone  VSS, no foul smelling lochia or uterine tenderness, normal WBC- low suspicion for endometritis Pain improved with medication. Likely related to new PP state and constipation. Precautions and management reviewed at length.   Assessment and Plan   1. Generalized abdominal pain   2. Postpartum state    -Discharge home in stable condition -Rx for limited number percocet and simethicone sent to patient's pharmacy -Abdominal pain  precautions discussed -Patient advised to follow-up with OB as scheduled for pp care -Patient may return to MAU as needed or if her condition were to change or worsen   Rolm Bookbinder CNM 03/27/2020, 12:22 PM

## 2020-03-27 NOTE — MAU Note (Signed)
Pt states no pain when resting, pain 2/10 when walking.

## 2020-03-27 NOTE — MAU Note (Signed)
Jenna Leblanc is a 39 y.o. here in MAU reporting: had vacuum assisted vaginal delivery on 11/15. Has been having upper and mid abdominal pain that radiates into her back. States sometimes it feels like gas pains, has tried gasex. Is concerned about infection, states no fever.   Onset of complaint: ongoing  Pain score: 1/10 with rest, 6/10 with walking, 8/10 with bending  Vitals:   03/27/20 1158  BP: 120/70  Pulse: 67  Resp: 18  Temp: 98.6 F (37 C)  SpO2: 97%     Lab orders placed from triage: none

## 2020-03-27 NOTE — Discharge Instructions (Signed)

## 2020-04-18 ENCOUNTER — Telehealth: Payer: Self-pay | Admitting: Nurse Practitioner

## 2020-04-18 NOTE — Telephone Encounter (Signed)
Called patient to discuss Covid symptoms and the use of casirivimab/imdevimab, a monoclonal antibody infusion for those with mild to moderate Covid symptoms and at a high risk of hospitalization.  Pt is qualified for this infusion at the [facility] center due to; Specific high risk criteria : BMI > 25 and Pregnancy (delivered w/in past month)   Message left to call back our hotline [phone].  Nicolasa Ducking, NP

## 2020-04-19 ENCOUNTER — Telehealth: Payer: Self-pay

## 2020-04-19 NOTE — Telephone Encounter (Signed)
[place] Night - Client TELEPHONE ADVICE RECORD [facility] Patient Name: Jenna Leblanc Gender: Female DOB: 1980-11-07 Age: 39 Y 10 M 20 D Return Phone Number: [phone] (Primary) Address: City/State/Zip: [place] [address] Client [facility] Night - Client Client Site [place] - Night Contact Type Call Who Is Calling Patient / Member / Family / Caregiver Call Type Triage / Clinical Relationship To Patient Self Return Phone Number [phone] (Primary) Chief Complaint Headache Reason for Call Symptomatic / Request for Ten Sleep states that she has Covid. She is breastfeeding. She has a fever, sore throat, sinus pressure, headache, and back pain. Translation No Nurse Assessment Nurse: Claiborne Billings, RN, Kim Date/Time (Eastern Time): 04/17/2020 8:37:42 AM Confirm and document reason for call. If symptomatic, describe symptoms. ---[name] states she took an at home COVID test this morning and is positive. States she has had fever, sore throat, sinus pressure, headache and back pain. Sxs started 2 days ago. Temp is 100.9. States she is breastfeeding her 50 month old child. tates that she has Covid. She is breastfeeding. Does the patient have any new or worsening symptoms? ---Yes Will a triage be completed? ---Yes Related visit to physician within the last 2 weeks? ---Yes Does the PT have any chronic conditions? (i.e. diabetes, asthma, this includes High risk factors for pregnancy, etc.) ---No Is the patient pregnant or possibly pregnant? (Ask all females between the ages of 68-55) ---No Is this a behavioral health or substance abuse call? ---No Guidelines Guideline Title Affirmed Question Affirmed Notes Nurse Date/Time (Eastern Time) COVID-19 - Diagnosed or Suspected [1] COVID-19 diagnosed by positive lab test (e.g., PCR, rapid selftest kit) AND [2] mild symptoms (e.g.,  cough, fever, others) AND [5] no complications or SOB Claiborne Billings, RN, Kim 04/17/2020 8:40:20 AM Disp. Time Eilene Ghazi Time) Disposition Final User 04/17/2020 8:48:10 AM Home Care Yes Claiborne Billings, RN, Maudie Mercury PLEASE NOTE: All timestamps contained within this report are represented as [place] Standard Time. CONFIDENTIALTY NOTICE: This fax transmission is intended only for the addressee. It contains information that is legally privileged, confidential or otherwise protected from use or disclosure. If you are not the intended recipient, you are strictly prohibited from reviewing, disclosing, copying using or disseminating any of this information or taking any action in reliance on or regarding this information. If you have received this fax in error, please notify us immediately by telephone so that we can arrange for its return to [place]. Phone: [phone], Toll-Free: [phone], Fax: [phone] Page: 2 of 2 Call Id: [id number] [place] Disagree/Comply Comply [name] Understands Yes PreDisposition Did not know what to do Care Advice Given Per Guideline HOME CARE: * You should be able to treat this at home. GENERAL CARE ADVICE FOR COVID-19 SYMPTOMS: * Cough: Use cough drops. * Feeling dehydrated: Drink extra liquids. If the air in your home is dry, use a humidifier. * Fever: For fever over 101 F (38.3 C), take acetaminophen every 4 to 6 hours (Adults 650 mg) OR ibuprofen every 6 to 8 hours (Adults 400 mg). Before taking any medicine, read all the instructions on the package. Do not take aspirin unless your doctor has prescribed it for you. * Muscle aches, headache, and other pains: Often this comes and goes with the fever. Take acetaminophen every 4 to 6 hours (Adults 650 mg) OR ibuprofen every 6 to 8 hours (Adults 400 mg). Before taking any medicine, read all the instructions on the package. * Sore throat: Try  throat lozenges, hard candy or warm chicken broth. HUMIDIFIER: * If the air is dry, use a humidifier in  the bedroom. HOW TO PROTECT OTHERS - WHEN YOU ARE SICK WITH COVID-19: * STAY HOME A MINIMUM OF 10 DAYS: Home isolation is needed for at least 10 days after the symptoms started. Stay home from school or work if you are sick. Do NOT go to religious services, child care centers, shopping, or other public places. Do NOT use public transportation (e.g., bus, taxis, ride-sharing). Do NOT allow any visitors to your home. Leave the house only if you need to seek urgent medical care. * COVER THE COUGH: Cough and sneeze into your shirt sleeve or inner elbow. Don't cough into your hand or the air. If available, cough into a tissue and throw it into a trash can. * [place] HANDS OFTEN: Wash hands often with soap and water. After coughing or sneezing are important times. If soap and water are not available, use an alcohol-based hand sanitizer with at least 60% alcohol, covering all surfaces of your hands and rubbing them together until they feel dry. Avoid touching your eyes, nose, and mouth with unwashed hands. CALL BACK IF: * Fever over 103 F (39.4 C) * Chest pain or difficulty breathing occurs * You become worse Comments User: Suezanne Jacquet, RN Date/Time Eilene Ghazi Time): 04/17/2020 8:51:59 AM Per CDC guidelines, caller is informed that she should follow isolation recommendations which were reviewed with her due to testing positive for COVID. Informed her that infant is considered a close contact and should quarantine for her same recommended time. Also informed her that she needs to wash hands before breastfeeding, wear a mask when she is less than 6 ft from the child (i.e. when breastfeeding), and if she pumps breastmilk she should clean and santitize breast pump, bottle, etc. [name] verbalizes understanding. [email or web address]

## 2020-04-19 NOTE — Telephone Encounter (Signed)
Sent pt a MyChart message to see how she is doing.

## 2020-04-19 NOTE — Telephone Encounter (Signed)
Please check on her later and see how she is doing

## 2020-04-19 NOTE — Telephone Encounter (Signed)
I spoke with pt; pt enroute now for monoclonal infusion. (see cardiology note 04/18/20).Pt said she guesses she is OK but her baby was dx with + covid on 04/18/20. Sending note to Dr Alphonsus Sias who will be in office this afternoon and [facility] CMA.

## 2020-04-19 NOTE — Telephone Encounter (Signed)
Pt responded to MyChart. She is having mild symptoms. The baby tested positive this morning at the ER.

## 2020-04-27 MED ORDER — CEPHALEXIN 500 MG PO CAPS: 500.0000 mg | ORAL_CAPSULE | Freq: Three times a day (TID) | ORAL | 1 refills | Status: DC

## 2020-04-27 NOTE — Telephone Encounter (Signed)
Patient called in checking on status. Please advise.  

## 2020-04-28 ENCOUNTER — Ambulatory Visit (INDEPENDENT_AMBULATORY_CARE_PROVIDER_SITE_OTHER): Payer: PRIVATE HEALTH INSURANCE | Admitting: Advanced Practice Midwife

## 2020-04-28 ENCOUNTER — Other Ambulatory Visit: Payer: Self-pay

## 2020-04-28 DIAGNOSIS — N61 Mastitis without abscess: Secondary | ICD-10-CM

## 2020-04-28 DIAGNOSIS — Z8616 Personal history of COVID-19: Secondary | ICD-10-CM

## 2020-04-28 DIAGNOSIS — U071 COVID-19: Secondary | ICD-10-CM

## 2020-04-28 MED ORDER — FLUCONAZOLE 150 MG PO TABS: 150.0000 mg | ORAL_TABLET | Freq: Once | ORAL | 0 refills | Status: AC

## 2020-04-28 NOTE — Patient Instructions (Signed)

## 2020-04-28 NOTE — Progress Notes (Addendum)
    Post Partum Visit Note  Jenna Leblanc is a 39 y.o. [id number] female who presents for a postpartum visit. She is 5 weeks postpartum following a vacuum-assisted vaginal delivery.  I have fully reviewed the prenatal and intrapartum course. The delivery was at 39.1 gestational weeks.  Anesthesia: epidural. Postpartum course has been uncomplicated. Baby is doing well. Baby is feeding by breast. Bleeding had some spotting 2 days agon, now its stopped. . Bowel function is normal. Bladder function is normal. Patient is not sexually active. Contraception method is none. Postpartum depression screening: negative.   The pregnancy intention screening data noted above was reviewed. Potential methods of contraception were discussed. The patient elected to proceed with Vasectomy.      The following portions of the patient's history were reviewed and updated as appropriate: allergies, current medications, past family history, past medical history, past social history, past surgical history and problem list.  Review of Systems A comprehensive review of systems was negative.    Objective:  BP 109/74   Pulse 72   Wt 147 lb (66.7 kg)   BMI 26.04 kg/m    General:  alert, cooperative, appears stated age and no distress   Breasts:  positive findings: warm, red 4cm x 2cm area on lateral aspect of left breast  Lungs: Unlabored breathing  Heart:  regular rate and rhythm, S1, S2 normal, no murmur, click, rub or gallop  Abdomen: soft, non-tender; bowel sounds normal; no masses,  no organomegaly   Vulva:  not evaluated  Vagina: not evaluated        Assessment:    Patient s/p prescription for Keflex for Mastitis, c/w physical exam. 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 Pap smear not done at today's visit but overdue.   Plan:   Essential components of care per ACOG recommendations:  1.  Mood and well being: Patient with negative depression screening today. Reviewed local resources for  support.  - Patient does not use tobacco.  - hx of drug use? No    2. Infant care and feeding:  -Patient currently breastmilk feeding? Yes If breastmilk feeding discussed return to work and pumping. If needed, patient was provided letter for work to allow for every 2-3 hr pumping breaks, and to be granted a private location to express breastmilk and refrigerated area to store breastmilk. Reviewed importance of draining breast regularly to support lactation.  3. Sexuality, contraception and birth spacing - Patient does not want a pregnancy in the next year.  S/p partner vasectomy   4. Sleep and fatigue -Encouraged family/partner/community support of 4 hrs of uninterrupted sleep to help with mood and fatigue  5. Physical Recovery  - Discussed patients delivery and complications - Patient has urinary incontinence? Yes, dribbling with sneezing or coughing. Declines referral to PT  - Patient is safe to resume physical and sexual activity  6.  Health Maintenance - Last pap smear done 08/01/2013 and was normal with negative HPV.   Well woman in one year  Clayton Bibles, MSN, CNM Certified Nurse Midwife, [employer] for [employer], [facility] 04/28/20 12:47 PM

## 2020-05-12 ENCOUNTER — Encounter: Payer: Self-pay | Admitting: Advanced Practice Midwife

## 2020-05-12 ENCOUNTER — Other Ambulatory Visit: Payer: Self-pay

## 2020-05-12 ENCOUNTER — Ambulatory Visit (INDEPENDENT_AMBULATORY_CARE_PROVIDER_SITE_OTHER): Payer: PRIVATE HEALTH INSURANCE | Admitting: Advanced Practice Midwife

## 2020-05-12 VITALS — BP 102/68 | HR 66 | Wt 148.0 lb

## 2020-05-12 DIAGNOSIS — Z01419 Encounter for gynecological examination (general) (routine) without abnormal findings: Secondary | ICD-10-CM

## 2020-05-12 NOTE — Progress Notes (Signed)
Pap smear from 2020 visible in media tab. Confirmed with patient. No assessment, no charge from today's visit.   Clayton Bibles, MSN, CNM Certified Nurse Midwife, [employer] for [employer], [facility] 05/12/20 3:28 PM

## 2020-06-28 ENCOUNTER — Other Ambulatory Visit: Payer: Self-pay | Admitting: Internal Medicine

## 2020-09-14 ENCOUNTER — Telehealth: Payer: Self-pay

## 2020-09-14 NOTE — Telephone Encounter (Signed)
Spoke to [employer]. Advised her what Dr Alphonsus Sias said. [facility] went to [facility]. They still decide to bring Cammie back here as a pt. She will talk to her husband.

## 2020-09-14 NOTE — Telephone Encounter (Signed)
Pt called wanting to know if you would be willing to see her 40 yo Jenna Leblanc as she is not happy with current Pediatrician.Marland KitchenMarland KitchenMarland Kitchen

## 2020-09-14 NOTE — Telephone Encounter (Signed)
I am okay with taking him--just make sure she knows I am only planning another 3 years

## 2020-11-02 ENCOUNTER — Other Ambulatory Visit: Payer: Self-pay | Admitting: Obstetrics and Gynecology

## 2020-11-12 ENCOUNTER — Telehealth (INDEPENDENT_AMBULATORY_CARE_PROVIDER_SITE_OTHER): Payer: No Typology Code available for payment source | Admitting: Internal Medicine

## 2020-11-12 ENCOUNTER — Encounter: Payer: Self-pay | Admitting: Internal Medicine

## 2020-11-12 DIAGNOSIS — J0140 Acute pansinusitis, unspecified: Secondary | ICD-10-CM | POA: Diagnosis not present

## 2020-11-12 MED ORDER — AMOXICILLIN 500 MG PO TABS: 1000.0000 mg | ORAL_TABLET | Freq: Two times a day (BID) | ORAL | 0 refills | Status: AC

## 2020-11-12 NOTE — Assessment & Plan Note (Signed)
Had typical URI then worsened after a week Likely secondary sinus infection Discussed supportive care Will use amoxil (safe with nursing) She will let me know next week if not improving

## 2020-11-12 NOTE — Progress Notes (Signed)
Subjective:    Patient ID: Jenna Leblanc, female    DOB: [birth year], 40 y.o.   MRN: [id number]  HPI Video virtual visit for respiratory symptoms Identification done Reviewed limitations and billing and she gave consent Participants--patient in her home and I am in my office  Started with cold for about a week since back from vacation Going on for 2 weeks now Started with runny nose, sneezing, drainage, sore throat Her baby also had illness COVID test negative  Now more drainage with green post nasal drip More sore throat No headache but some sinus pressure No fever or SOB  Has taken some nasal sprays and mucinex Has to be carefull due to still nursing  Current Outpatient Medications on File Prior to Visit  Medication Sig Dispense Refill   acetaminophen (TYLENOL) 325 MG tablet Take 2 tablets (650 mg total) by mouth every 4 (four) hours as needed (for pain scale < 4).     ondansetron (ZOFRAN) 4 MG tablet TAKE 1 TABLET BY MOUTH EVERY 8 HOURS AS NEEDED FOR NAUSEA AND VOMITING 20 tablet 1   No current facility-administered medications on file prior to visit.    No Known Allergies  Past Medical History:  Diagnosis Date   Family history of hypertrophic cardiomyopathy 06/07/2016   Gestational diabetes    diet controlled   H/O gestational diabetes in prior pregnancy, currently pregnant 08/21/2019   Early a1c negative   IBS (irritable bowel syndrome)    Diarrhea type   Kidney stones    none in 8 years   Oral herpes simplex infection    Never had genital    Past Surgical History:  Procedure Laterality Date   LITHOTRIPSY     VAGINAL DELIVERY      Family History  Problem Relation Age of Onset   Cancer Sister        sarcoma in the uterus   Cancer Maternal Grandmother        lung   Diabetes Maternal Grandmother    Heart attack Maternal Grandfather    Pneumonia Paternal Grandmother    Diabetes Paternal Grandmother    Cancer Paternal Grandfather        ?spine     Social History   Socioeconomic History   Marital status: Married    Spouse name: Barbara Cower   Number of children: 1   Years of education: Not on file   Highest education level: Not on file  Occupational History   Occupation: Real Therapist, occupational  Tobacco Use   Smoking status: Former    Pack years: 0.00    Types: Cigarettes    Quit date: 07/15/2006    Years since quitting: 14.3   Smokeless tobacco: Never  Vaping Use   Vaping Use: Never used  Substance and Sexual Activity   Alcohol use: Not Currently    Alcohol/week: 0.0 standard drinks    Comment: 2-3 times per week   Drug use: No   Sexual activity: Yes    Partners: Male    Birth control/protection: None, Condom  Other Topics Concern   Not on file  Social History Narrative   Not on file   Social Determinants of Health   Financial Resource Strain: Not on file  Food Insecurity: Not on file  Transportation Needs: Not on file  Physical Activity: Not on file  Stress: Not on file  Social Connections: Not on file  Intimate Partner Violence: Not on file   Review of Systems No loss of smell  or taste No N/V Appetite is okay     Objective:   Physical Exam Constitutional:      General: She is not in acute distress.    Appearance: Normal appearance.  HENT:     Mouth/Throat:     Pharynx: No oropharyngeal exudate or posterior oropharyngeal erythema.     Comments: No sig tonsillar enlargement Pulmonary:     Effort: Pulmonary effort is normal. No respiratory distress.  Neurological:     Mental Status: She is alert.           Assessment & Plan:

## 2021-02-10 ENCOUNTER — Other Ambulatory Visit: Payer: Self-pay | Admitting: Internal Medicine

## 2021-05-29 ENCOUNTER — Encounter: Payer: Self-pay | Admitting: Internal Medicine

## 2021-05-30 MED ORDER — ONDANSETRON HCL 4 MG PO TABS: ORAL_TABLET | ORAL | 0 refills | Status: DC

## 2021-05-30 MED ORDER — HYOSCYAMINE SULFATE 0.125 MG SL SUBL: 0.1250 mg | SUBLINGUAL_TABLET | SUBLINGUAL | 0 refills | Status: DC | PRN

## 2021-06-01 ENCOUNTER — Telehealth: Payer: 59 | Admitting: Family

## 2021-06-01 ENCOUNTER — Other Ambulatory Visit: Payer: Self-pay

## 2021-06-01 ENCOUNTER — Emergency Department
Admission: EM | Admit: 2021-06-01 | Discharge: 2021-06-01 | Disposition: A | Discharge status: Home / Self Care | Payer: 59 | Attending: Emergency Medicine | Admitting: Emergency Medicine

## 2021-06-01 DIAGNOSIS — R1084 Generalized abdominal pain: Secondary | ICD-10-CM | POA: Diagnosis present

## 2021-06-01 DIAGNOSIS — Z20822 Contact with and (suspected) exposure to covid-19: Secondary | ICD-10-CM | POA: Diagnosis not present

## 2021-06-01 DIAGNOSIS — K529 Noninfective gastroenteritis and colitis, unspecified: Secondary | ICD-10-CM | POA: Diagnosis not present

## 2021-06-01 DIAGNOSIS — R197 Diarrhea, unspecified: Secondary | ICD-10-CM | POA: Diagnosis not present

## 2021-06-01 DIAGNOSIS — A09 Infectious gastroenteritis and colitis, unspecified: Secondary | ICD-10-CM | POA: Diagnosis not present

## 2021-06-01 DIAGNOSIS — E860 Dehydration: Secondary | ICD-10-CM

## 2021-06-01 LAB — URINALYSIS, ROUTINE W REFLEX MICROSCOPIC
Glucose, UA: NEGATIVE mg/dL
Ketones, ur: 40 mg/dL — AB
Nitrite: NEGATIVE
Protein, ur: 30 mg/dL — AB
Specific Gravity, Urine: >1.03 — ABNORMAL HIGH (ref 1.005–1.030)
pH: 5 (ref 5.0–8.0)

## 2021-06-01 LAB — C DIFFICILE QUICK SCREEN W PCR REFLEX
C Diff antigen: NEGATIVE
C Diff interpretation: NOT DETECTED
C Diff toxin: NEGATIVE

## 2021-06-01 LAB — COMPREHENSIVE METABOLIC PANEL
ALT: 15 U/L (ref 0–44)
AST: 15 U/L (ref 15–41)
Albumin: 4.4 g/dL (ref 3.5–5.0)
Alkaline Phosphatase: 46 U/L (ref 38–126)
Anion gap: 10 (ref 5–15)
BUN: 13 mg/dL (ref 6–20)
CO2: 21 mmol/L — ABNORMAL LOW (ref 22–32)
Calcium: 8.8 mg/dL — ABNORMAL LOW (ref 8.9–10.3)
Chloride: 104 mmol/L (ref 98–111)
Creatinine, Ser: 0.59 mg/dL (ref 0.44–1.00)
GFR, Estimated: >60 mL/min (ref >60)
Glucose, Bld: 106 mg/dL — ABNORMAL HIGH (ref 70–99)
Potassium: 3.8 mmol/L (ref 3.5–5.1)
Sodium: 135 mmol/L (ref 135–145)
Total Bilirubin: 0.9 mg/dL (ref 0.3–1.2)
Total Protein: 7.6 g/dL (ref 6.5–8.1)

## 2021-06-01 LAB — RESP PANEL BY RT-PCR (FLU A&B, COVID) ARPGX2
Influenza A by PCR: NEGATIVE
Influenza B by PCR: NEGATIVE
SARS Coronavirus 2 by RT PCR: NEGATIVE

## 2021-06-01 LAB — CBC
HCT: 43.6 % (ref 36.0–46.0)
Hemoglobin: 13.9 g/dL (ref 12.0–15.0)
MCH: 29 pg (ref 26.0–34.0)
MCHC: 31.9 g/dL (ref 30.0–36.0)
MCV: 90.8 fL (ref 80.0–100.0)
Platelets: 200 10*3/uL (ref 150–400)
RBC: 4.8 MIL/uL (ref 3.87–5.11)
RDW: 13.3 % (ref 11.5–15.5)
WBC: 8.1 10*3/uL (ref 4.0–10.5)
nRBC: 0 % (ref 0.0–0.2)

## 2021-06-01 LAB — URINALYSIS, MICROSCOPIC (REFLEX)

## 2021-06-01 LAB — PREGNANCY, URINE: Preg Test, Ur: NEGATIVE

## 2021-06-01 LAB — MAGNESIUM: Magnesium: 2.3 mg/dL (ref 1.7–2.4)

## 2021-06-01 LAB — LIPASE, BLOOD: Lipase: 28 U/L (ref 11–51)

## 2021-06-01 MED ORDER — DICYCLOMINE HCL 10 MG PO CAPS: 10.0000 mg | ORAL_CAPSULE | Freq: Three times a day (TID) | ORAL | 0 refills | Status: DC | PRN

## 2021-06-01 MED ORDER — CIPROFLOXACIN HCL 500 MG PO TABS: 500.0000 mg | ORAL_TABLET | Freq: Two times a day (BID) | ORAL | 0 refills | Status: DC

## 2021-06-01 MED ORDER — LOPERAMIDE HCL 2 MG PO TABS: 2.0000 mg | ORAL_TABLET | Freq: Three times a day (TID) | ORAL | 0 refills | Status: AC | PRN

## 2021-06-01 MED ORDER — DICYCLOMINE HCL 10 MG PO CAPS
10.0000 mg | ORAL_CAPSULE | Freq: Once | ORAL | Status: AC
  Administered 2021-06-01: 22:00:00 10 mg via ORAL
  Filled 2021-06-01: qty 1

## 2021-06-01 MED ORDER — LACTATED RINGERS IV BOLUS
1000.0000 mL | Freq: Once | INTRAVENOUS | Status: AC
  Administered 2021-06-01: 22:00:00 1000 mL via INTRAVENOUS

## 2021-06-01 MED ORDER — ONDANSETRON 4 MG PO TBDP
4.0000 mg | ORAL_TABLET | Freq: Three times a day (TID) | ORAL | 0 refills | Status: DC | PRN
Start: 2021-06-01 — End: 2021-06-01

## 2021-06-01 MED ORDER — ONDANSETRON 4 MG PO TBDP: 4.0000 mg | ORAL_TABLET | Freq: Three times a day (TID) | ORAL | 0 refills | Status: AC | PRN

## 2021-06-01 MED ORDER — ONDANSETRON HCL 4 MG PO TABS: 4.0000 mg | ORAL_TABLET | Freq: Three times a day (TID) | ORAL | 1 refills | Status: DC | PRN

## 2021-06-01 MED ORDER — ONDANSETRON HCL 4 MG/2ML IJ SOLN
4.0000 mg | Freq: Once | INTRAMUSCULAR | Status: AC
  Administered 2021-06-01: 22:00:00 4 mg via INTRAVENOUS
  Filled 2021-06-01: qty 2

## 2021-06-01 MED ORDER — ONDANSETRON 4 MG PO TBDP
4.0000 mg | ORAL_TABLET | Freq: Once | ORAL | Status: AC
  Administered 2021-06-01: 20:00:00 4 mg via ORAL
  Filled 2021-06-01: qty 1

## 2021-06-01 NOTE — Progress Notes (Signed)
Virtual Visit Consent   Jenna Leblanc, you are scheduled for a virtual visit with a [facility] provider today.     Just as with appointments in the office, your consent must be obtained to participate.  Your consent will be active for this visit and any virtual visit you may have with one of our providers in the next 365 days.     If you have a MyChart account, a copy of this consent can be sent to you electronically.  All virtual visits are billed to your insurance company just like a traditional visit in the office.    As this is a virtual visit, video technology does not allow for your provider to perform a traditional examination.  This may limit your provider's ability to fully assess your condition.  If your provider identifies any concerns that need to be evaluated in person or the need to arrange testing (such as labs, EKG, etc.), we will make arrangements to do so.     Although advances in technology are sophisticated, we cannot ensure that it will always work on either your end or our end.  If the connection with a video visit is poor, the visit may have to be switched to a telephone visit.  With either a video or telephone visit, we are not always able to ensure that we have a secure connection.     I need to obtain your verbal consent now.   Are you willing to proceed with your visit today?    Jenna Leblanc has provided verbal consent on 06/01/2021 for a virtual visit (video or telephone).   Evelina Dun, FNP   Date: 06/01/2021 10:54 AM   Virtual Visit via Video Note   I, Evelina Dun, connected with  Jenna Leblanc  (IE:[id number], 08-10-1980) on 06/01/21 at 10:45 AM EST by a video-enabled telemedicine application and verified that I am speaking with the correct person using two identifiers.  Location: Patient: Virtual Visit Location Patient: Home Provider: Virtual Visit Location Provider: Home Office   I discussed the limitations of evaluation and management by  telemedicine and the availability of in person appointments. The patient expressed understanding and agreed to proceed.    History of Present Illness: Jenna Leblanc is a 41 y.o. who identifies as a female who was assigned female at birth, and is being seen today for nausea. She reports she came back from [place] Thursday, and started having nausea and diarrhea Wednesday.   HPI: Diarrhea  This is a new problem. The current episode started in the past 7 days. The problem occurs 5 to 10 times per day. The problem has been gradually worsening. The stool consistency is described as Watery. Associated symptoms include headaches. Pertinent negatives include no bloating, chills, coughing, fever (99), increased  flatus or vomiting. Risk factors include travel to endemic area. She has tried bismuth subsalicylate (zofran) for the symptoms. The treatment provided mild relief.   Problems:  Patient Active Problem List   Diagnosis Date Noted   Acute non-recurrent pansinusitis 11/12/2020   Normal labor 03/22/2020   Indication for care in labor or delivery 03/22/2020   Gestational thrombocytopenia ([place]) 03/22/2020   Vacuum-assisted vaginal delivery 03/22/2020   Encounter for supervision of multigravida of advanced maternal age in third trimester 08/21/2019   IBS (irritable bowel syndrome) 04/27/2015    Allergies: No Known Allergies Medications:  Current Outpatient Medications:    ciprofloxacin (CIPRO) 500 MG tablet, Take 1 tablet (500 mg total) by  mouth 2 (two) times daily., Disp: 10 tablet, Rfl: 0   ondansetron (ZOFRAN) 4 MG tablet, Take 1 tablet (4 mg total) by mouth every 8 (eight) hours as needed for nausea or vomiting., Disp: 20 tablet, Rfl: 1   hyoscyamine (LEVSIN SL) 0.125 MG SL tablet, Place 1 tablet (0.125 mg total) under the tongue every 4 (four) hours as needed., Disp: 30 tablet, Rfl: 0  Observations/Objective: Patient is well-developed, well-nourished in no acute distress.  Resting  comfortably  at home.  Head is normocephalic, atraumatic.  No labored breathing.  Speech is clear and coherent with logical content.  Patient is alert and oriented at baseline.    Assessment and Plan: 1. Diarrhea, unspecified type - ciprofloxacin (CIPRO) 500 MG tablet; Take 1 tablet (500 mg total) by mouth 2 (two) times daily.  Dispense: 10 tablet; Refill: 0 - ondansetron (ZOFRAN) 4 MG tablet; Take 1 tablet (4 mg total) by mouth every 8 (eight) hours as needed for nausea or vomiting.  Dispense: 20 tablet; Refill: 1  2. Traveler's diarrhea  BRAT diet Force fluids Bland diet Imodium as needed Zofran as needed for nausea Follow up if symptoms worsen or do not improve   Follow Up Instructions: I discussed the assessment and treatment plan with the patient. The patient was provided an opportunity to ask questions and all were answered. The patient agreed with the plan and demonstrated an understanding of the instructions.  A copy of instructions were sent to the patient via MyChart unless otherwise noted below.     The patient was advised to call back or seek an in-person evaluation if the symptoms worsen or if the condition fails to improve as anticipated.  Time:  I spent 9 minutes with the patient via telehealth technology discussing the above problems/concerns.    Evelina Dun, FNP

## 2021-06-01 NOTE — ED Triage Notes (Signed)
Pt to ED from home c/o diarrhea, nausea, abd pain and cramping mid, states feels dehydrated, strong odor and dark urine for approx 1 week.  States was in [place] the week prior to feeling bad, stayed at a resort but ate off resort a lot, drank bottle water.  States son became ill with 24 hour vomiting.  Pt A&Ox4, chest rise even and unlabored, skin WNL and in NAD at this time.

## 2021-06-01 NOTE — ED Provider Notes (Signed)
[facility] Provider Note    Event Date/Time   First MD Initiated Contact with Patient 06/01/21 2124     (approximate)   History   Diarrhea   HPI  Jenna Leblanc is a 41 y.o. female with a past medical history of IBS prescribed as needed hyoscyamine who presents accompanied by parents for assessment of generalized crampy abdominal pain associate with nonbloody nonbilious vomiting and diarrhea that started about 6 days ago when she returned from [place].  She notes her son also had similar symptoms and she is not sure if she got something from [place] or her son.  She had a little bit of a cough today but otherwise has not been coughing much and has not had any chest pain, back pain, shortness of breath, burning with urination, blood in her urine, vaginal bleeding or discharge, earache, sore throat, rash or extremity pain.  She has tried some OTC Zofran does not help much.  She did a telehealth visit earlier today and was quite Cipro but came to emergency room because she felt she was dehydrated.  No significant EtOH use illicit drug use or any other acute concerns at this time.       Physical Exam  Triage Vital Signs: ED Triage Vitals  Enc Vitals Group     BP 06/01/21 2005 101/67     Pulse Rate 06/01/21 2005 83     Resp 06/01/21 2005 16     Temp 06/01/21 2005 98.6 F (37 C)     Temp Source 06/01/21 2005 Oral     SpO2 06/01/21 2005 98 %     Weight 06/01/21 2005 137 lb (62.1 kg)     Height 06/01/21 2005 5\' 3"  (1.6 m)     Head Circumference --      Peak Flow --      Pain Score 06/01/21 2011 7     Pain Loc --      Pain Edu? --      Excl. in GC? --     Most recent vital signs: Vitals:   06/01/21 2005  BP: 101/67  Pulse: 83  Resp: 16  Temp: 98.6 F (37 C)  SpO2: 98%    General: Awake, no distress.  CV:  Prolonged capillary refill in his digits.  2+ radial pulses.  No murmurs rubs or gallops. Resp:  Normal effort.  Clear bilaterally Abd:  No  distention.  Soft throughout.  No significant CVA tenderness. Other:  Dry mucous membranes.   ED Results / Procedures / Treatments  Labs (all labs ordered are listed, but only abnormal results are displayed) Labs Reviewed  COMPREHENSIVE METABOLIC PANEL - Abnormal; Notable for the following components:      Result Value   CO2 21 (*)    Glucose, Bld 106 (*)    Calcium 8.8 (*)    All other components within normal limits  URINALYSIS, ROUTINE W REFLEX MICROSCOPIC - Abnormal; Notable for the following components:   Specific Gravity, Urine >1.030 (*)    Hgb urine dipstick TRACE (*)    Bilirubin Urine SMALL (*)    Ketones, ur 40 (*)    Protein, ur 30 (*)    Leukocytes,Ua TRACE (*)    All other components within normal limits  URINALYSIS, MICROSCOPIC (REFLEX) - Abnormal; Notable for the following components:   Bacteria, UA RARE (*)    All other components within normal limits  RESP PANEL BY RT-PCR (FLU A&B, COVID) ARPGX2  GASTROINTESTINAL PANEL BY PCR, STOOL (REPLACES STOOL CULTURE)  C DIFFICILE QUICK SCREEN W PCR REFLEX    URINE CULTURE  LIPASE, BLOOD  CBC  PREGNANCY, URINE  MAGNESIUM  POC URINE PREG, ED     EKG   RADIOLOGY    PROCEDURES:  Critical Care performed: No  Procedures    MEDICATIONS ORDERED IN ED: Medications  ondansetron (ZOFRAN-ODT) disintegrating tablet 4 mg (4 mg Oral Given 06/01/21 2022)  lactated ringers bolus 1,000 mL (1,000 mLs Intravenous New Bag/Given 06/01/21 2210)  ondansetron (ZOFRAN) injection 4 mg (4 mg Intravenous Given 06/01/21 2211)  dicyclomine (BENTYL) capsule 10 mg (10 mg Oral Given 06/01/21 2158)     IMPRESSION / MDM / ASSESSMENT AND PLAN / ED COURSE  I reviewed the triage vital signs and the nursing notes.                              Differential diagnosis includes, but is not limited to traveler's diarrhea, IBS flare, metabolic derangement, pancreatitis with a lower suspicion based on patient's history and exam and recent  travel for acute appendicitis or urinary tract infection or diverticulitis.  CMP shows no significant electrolyte or metabolic derangements.  CBC without leukocytosis or acute anemia.  COVID influenza PCR negative patient has no shortness of breath chest pain or abnormal breath sounds or other findings to suggest bacterial pneumonia.  Certainly possible her cough is from mild bronchitis.  UA with some ketones protein and trace leukocyte esterase and rare bacteria.  Patient has no urinary symptoms and a low suspicion for clinically significant cystitis although we will send urine cultures.  Pregnancy test is negative.  Magnesium within normal limits.  GI pathogen panel and C. difficile sent.  Patient was already prescribed Cipro earlier today for possible traveler's diarrhea.  This is reasonable to complete this course.  She is feeling better on my reassessment after some IV fluids Bentyl and IV Zofran.  She is now able tolerate water without too much difficulty.  Given stable vitals with eyes reassuring exam work-up patient now able tolerate oral hydration I think she is stable for discharge with outpatient follow-up.  Rx written for ODT Zofran, Bentyl, and Imodium.  Advised patient she can follow-up her GI pathogen panel and C. difficile screen with her PCP.  Discharged in stable condition.     FINAL CLINICAL IMPRESSION(S) / ED DIAGNOSES   Final diagnoses:  Gastroenteritis  Dehydration     Rx / DC Orders   ED Discharge Orders          Ordered    dicyclomine (BENTYL) 10 MG capsule  3 times daily PRN        06/01/21 2304    loperamide (IMODIUM A-D) 2 MG tablet  3 times daily PRN        06/01/21 2304    ondansetron (ZOFRAN-ODT) 4 MG disintegrating tablet  Every 8 hours PRN        06/01/21 2304             Note:  This document was prepared using Dragon voice recognition software and may include unintentional dictation errors.   Gilles Chiquito, MD 06/01/21 [phone]

## 2021-06-02 LAB — GASTROINTESTINAL PANEL BY PCR, STOOL (REPLACES STOOL CULTURE)

## 2021-06-03 LAB — URINE CULTURE: Culture: 30000 — AB

## 2021-06-06 ENCOUNTER — Encounter: Payer: Self-pay | Admitting: Internal Medicine

## 2021-06-09 ENCOUNTER — Other Ambulatory Visit: Payer: Self-pay

## 2021-06-09 ENCOUNTER — Ambulatory Visit (INDEPENDENT_AMBULATORY_CARE_PROVIDER_SITE_OTHER): Payer: 59 | Admitting: Internal Medicine

## 2021-06-09 ENCOUNTER — Encounter: Payer: Self-pay | Admitting: Internal Medicine

## 2021-06-09 DIAGNOSIS — F419 Anxiety disorder, unspecified: Secondary | ICD-10-CM | POA: Diagnosis not present

## 2021-06-09 MED ORDER — SERTRALINE HCL 25 MG PO TABS: 25.0000 mg | ORAL_TABLET | Freq: Every day | ORAL | 3 refills | Status: DC

## 2021-06-09 MED ORDER — HYDROXYZINE HCL 10 MG PO TABS: 10.0000 mg | ORAL_TABLET | Freq: Every evening | ORAL | 0 refills | Status: DC | PRN

## 2021-06-09 NOTE — Assessment & Plan Note (Signed)
Seems reasonable to try low dose sertraline 25mg  Discussed side effects  She will let me know if no help in a month--can increase the dose  Sister also gave her a hydroxyzine--it helped her sleep

## 2021-06-09 NOTE — Progress Notes (Signed)
Subjective:    Patient ID: Jenna Leblanc, female    DOB: [birth year], 41 y.o.   MRN: [id number]  HPI Here due to concerns about anxiety  More anxiety since 2nd baby Now a bit over a year old Daughter is now 7  Mostly having tense and tight feelings at night Will get worried about "my child's health"---things that never happen  Her mind just goes wild--"crazy scenarios" Will get headache and then not sleep Wonders if it is worse since COVID Worries about her husband also Doesn't worry about money  Less obvious in day--she is working and  they are in school/day care  Sister on zoloft and has done very well  Current Outpatient Medications on File Prior to Visit  Medication Sig Dispense Refill   hyoscyamine (LEVSIN SL) 0.125 MG SL tablet Place 1 tablet (0.125 mg total) under the tongue every 4 (four) hours as needed. 30 tablet 0   No current facility-administered medications on file prior to visit.    No Known Allergies  Past Medical History:  Diagnosis Date   Family history of hypertrophic cardiomyopathy 06/07/2016   Gestational diabetes    diet controlled   H/O gestational diabetes in prior pregnancy, currently pregnant 08/21/2019   Early a1c negative   IBS (irritable bowel syndrome)    Diarrhea type   Kidney stones    none in 8 years   Oral herpes simplex infection    Never had genital    Past Surgical History:  Procedure Laterality Date   LITHOTRIPSY     VAGINAL DELIVERY      Family History  Problem Relation Age of Onset   Cancer Sister        sarcoma in the uterus   Cancer Maternal Grandmother        lung   Diabetes Maternal Grandmother    Heart attack Maternal Grandfather    Pneumonia Paternal Grandmother    Diabetes Paternal Grandmother    Cancer Paternal Grandfather        ?spine    Social History   Socioeconomic History   Marital status: Married    Spouse name: Barbara Cower   Number of children: 1   Years of education: Not on file   Highest  education level: Not on file  Occupational History   Occupation: Real Therapist, occupational  Tobacco Use   Smoking status: Former    Types: Cigarettes    Quit date: 07/15/2006    Years since quitting: 14.9   Smokeless tobacco: Never  Vaping Use   Vaping Use: Never used  Substance and Sexual Activity   Alcohol use: Not Currently    Alcohol/week: 0.0 standard drinks    Comment: 2-3 times per week   Drug use: No   Sexual activity: Yes    Partners: Male    Birth control/protection: None, Condom  Other Topics Concern   Not on file  Social History Narrative   Not on file   Social Determinants of Health   Financial Resource Strain: Not on file  Food Insecurity: Not on file  Transportation Needs: Not on file  Physical Activity: Not on file  Stress: Not on file  Social Connections: Not on file  Intimate Partner Violence: Not on file   Review of Systems Only regular meds are for stomach/nausea No depression Still nursing 1-2 times a day until recent norovirus. Now just once a day (very little)    Objective:   Physical Exam Constitutional:  Appearance: Normal appearance.  Neurological:     Mental Status: She is alert.  Psychiatric:        Mood and Affect: Mood normal.        Behavior: Behavior normal.           Assessment & Plan:

## 2021-07-07 ENCOUNTER — Other Ambulatory Visit: Payer: Self-pay | Admitting: Internal Medicine

## 2021-08-03 ENCOUNTER — Other Ambulatory Visit: Payer: Self-pay

## 2021-08-03 ENCOUNTER — Ambulatory Visit (INDEPENDENT_AMBULATORY_CARE_PROVIDER_SITE_OTHER): Payer: 59 | Admitting: Internal Medicine

## 2021-08-03 ENCOUNTER — Encounter: Payer: Self-pay | Admitting: Internal Medicine

## 2021-08-03 VITALS — BP 102/68 | HR 67 | Ht 63.0 in | Wt 137.0 lb

## 2021-08-03 DIAGNOSIS — F419 Anxiety disorder, unspecified: Secondary | ICD-10-CM

## 2021-08-03 DIAGNOSIS — Z0000 Encounter for general adult medical examination without abnormal findings: Secondary | ICD-10-CM

## 2021-08-03 DIAGNOSIS — R82998 Other abnormal findings in urine: Secondary | ICD-10-CM

## 2021-08-03 MED ORDER — ONDANSETRON 4 MG PO TBDP: 4.0000 mg | ORAL_TABLET | Freq: Three times a day (TID) | ORAL | 2 refills | Status: DC | PRN

## 2021-08-03 NOTE — Assessment & Plan Note (Signed)
Doing well on the low dose sertraline 25 daily ?Uses hydroxyzine occasionally for sleep ?Ondansetron prn for nausea (goes along with the anxiety) ?

## 2021-08-03 NOTE — Assessment & Plan Note (Signed)
Healthy ?Stays fit ?Paps at gyn ?Will consider mammograms now ?Prefers no COVID vaccine ?Flu vaccine in the fall ?

## 2021-08-03 NOTE — Progress Notes (Signed)
? ?Subjective:  ? ? Patient ID: Jenna Leblanc, female    DOB: 1981-03-05, 41 y.o.   MRN: [id number] ? ?HPI ?Here for physical ? ?Happy with the sertraline ?Anxiety is better ?Still uses the hydroxyzine at bedtime prn--if her mind is going ? ?Did notice more anxiety after stopping breast feeding ?Thinks there may be hormonal basis ?Is due to see gyn soon ? ?Is exercising regularly---twice a week trainer ?Plays pickle ball regularly ? ?Current Outpatient Medications on File Prior to Visit  ?Medication Sig Dispense Refill  ? hydrOXYzine (ATARAX) 10 MG tablet TAKE 1 TABLET BY MOUTH AT BEDTIME AS NEEDED. 30 tablet 0  ? hyoscyamine (LEVSIN SL) 0.125 MG SL tablet Place 1 tablet (0.125 mg total) under the tongue every 4 (four) hours as needed. 30 tablet 0  ? sertraline (ZOLOFT) 25 MG tablet Take 1 tablet (25 mg total) by mouth daily. 30 tablet 3  ? ?No current facility-administered medications on file prior to visit.  ? ? ?No Known Allergies ? ?Past Medical History:  ?Diagnosis Date  ? Family history of hypertrophic cardiomyopathy 06/07/2016  ? Gestational diabetes   ? diet controlled  ? H/O gestational diabetes in prior pregnancy, currently pregnant 08/21/2019  ? Early a1c negative  ? IBS (irritable bowel syndrome)   ? Diarrhea type  ? Kidney stones   ? none in 8 years  ? Oral herpes simplex infection   ? Never had genital  ? ? ?Past Surgical History:  ?Procedure Laterality Date  ? LITHOTRIPSY    ? VAGINAL DELIVERY    ? ? ?Family History  ?Problem Relation Age of Onset  ? Cancer Sister   ?     sarcoma in the uterus  ? Cancer Maternal Grandmother   ?     lung  ? Diabetes Maternal Grandmother   ? Heart attack Maternal Grandfather   ? Pneumonia Paternal Grandmother   ? Diabetes Paternal Grandmother   ? Cancer Paternal Grandfather   ?     ?spine  ? ? ?Social History  ? ?Socioeconomic History  ? Marital status: Married  ?  Spouse name: Barbara Cower  ? Number of children: 1  ? Years of education: Not on file  ? Highest education level:  Not on file  ?Occupational History  ? Occupation: Real Therapist, occupational  ?Tobacco Use  ? Smoking status: Former  ?  Types: Cigarettes  ?  Quit date: 07/15/2006  ?  Years since quitting: 15.0  ? Smokeless tobacco: Never  ?Vaping Use  ? Vaping Use: Never used  ?Substance and Sexual Activity  ? Alcohol use: Not Currently  ?  Alcohol/week: 0.0 standard drinks  ?  Comment: 2-3 times per week  ? Drug use: No  ? Sexual activity: Yes  ?  Partners: Male  ?  Birth control/protection: None, Condom  ?Other Topics Concern  ? Not on file  ?Social History Narrative  ? Not on file  ? ?Social Determinants of Health  ? ?Financial Resource Strain: Not on file  ?Food Insecurity: Not on file  ?Transportation Needs: Not on file  ?Physical Activity: Not on file  ?Stress: Not on file  ?Social Connections: Not on file  ?Intimate Partner Violence: Not on file  ? ?Review of Systems  ?Constitutional:  Negative for fatigue and unexpected weight change.  ?     Wears seat belt  ?HENT:  Positive for hearing loss. Negative for dental problem and tinnitus.   ?     Due for  dentist  ?Eyes:  Negative for visual disturbance.  ?     No diplopia or unilateral vision loss  ?Respiratory:  Negative for cough, chest tightness and shortness of breath.   ?Cardiovascular:  Negative for chest pain, palpitations and leg swelling.  ?Gastrointestinal:  Negative for blood in stool.  ?     Some IBS--diarrhea. No change with sertraline ?Episodic nausea---ondansetron helped  ?Endocrine: Negative for polydipsia and polyuria.  ?Genitourinary:  Negative for difficulty urinating, dyspareunia and dysuria.  ?     Husband had vasectomy  ?Musculoskeletal:  Negative for arthralgias, back pain and joint swelling.  ?Skin:  Negative for rash.  ?     No suspicious lesions  ?Allergic/Immunologic: Positive for environmental allergies. Negative for immunocompromised state.  ?     Zyrtec daily---not totally effective ?Discussed adding second antihistamine  ?Neurological:  Negative for  dizziness, syncope, light-headedness and headaches.  ?Hematological:  Negative for adenopathy. Does not bruise/bleed easily.  ?Psychiatric/Behavioral:  Negative for dysphoric mood and sleep disturbance.   ? ?   ?Objective:  ? Physical Exam ?Constitutional:   ?   Appearance: Normal appearance.  ?HENT:  ?   Mouth/Throat:  ?   Pharynx: No oropharyngeal exudate or posterior oropharyngeal erythema.  ?Eyes:  ?   Conjunctiva/sclera: Conjunctivae normal.  ?   Pupils: Pupils are equal, round, and reactive to light.  ?Cardiovascular:  ?   Rate and Rhythm: Normal rate and regular rhythm.  ?   Pulses: Normal pulses.  ?   Heart sounds: No murmur heard. ?  No gallop.  ?Pulmonary:  ?   Effort: Pulmonary effort is normal.  ?   Breath sounds: Normal breath sounds. No wheezing or rales.  ?Abdominal:  ?   Palpations: Abdomen is soft.  ?   Tenderness: There is no abdominal tenderness.  ?Musculoskeletal:  ?   Cervical back: Neck supple.  ?   Right lower leg: No edema.  ?   Left lower leg: No edema.  ?Lymphadenopathy:  ?   Cervical: No cervical adenopathy.  ?Skin: ?   Findings: No lesion or rash.  ?   Comments: Small cyst on lateral left lower eyelid  ?Neurological:  ?   General: No focal deficit present.  ?   Mental Status: She is alert and oriented to person, place, and time.  ?Psychiatric:     ?   Mood and Affect: Mood normal.     ?   Behavior: Behavior normal.  ?  ? ? ? ? ?   ?Assessment & Plan:  ? ?

## 2021-08-05 ENCOUNTER — Other Ambulatory Visit: Payer: Self-pay | Admitting: Internal Medicine

## 2021-08-05 NOTE — Telephone Encounter (Signed)
Is this okay to refill ? ? ?Last filled  07/08/21 qty 30 ?Last OV 08/03/21 ?Next OV 08/07/22 ?

## 2021-08-31 ENCOUNTER — Encounter: Payer: Self-pay | Admitting: Internal Medicine

## 2021-08-31 MED ORDER — TIZANIDINE HCL 2 MG PO TABS: 2.0000 mg | ORAL_TABLET | Freq: Every evening | ORAL | 1 refills | Status: DC | PRN

## 2021-09-04 ENCOUNTER — Other Ambulatory Visit: Payer: Self-pay | Admitting: Internal Medicine

## 2021-09-05 NOTE — Telephone Encounter (Signed)
Pharmacy requesting 90 day supply

## 2021-09-17 IMAGING — US US PELVIS COMPLETE
1 series · 15 of 25 positions shown · non-contrast
Comparison: None.

CLINICAL DATA: Abdominal pain 5 days postpartum.

EXAM:
TRANSABDOMINAL ULTRASOUND OF PELVIS
TECHNIQUE: Transabdominal ultrasound examination of the pelvis was performed
including evaluation of the uterus, ovaries, adnexal regions, and
pelvic cul-de-sac.

[Series 1: us pelvis complete · 37 acquisitions, 15 frames shown]
[im 1/37]
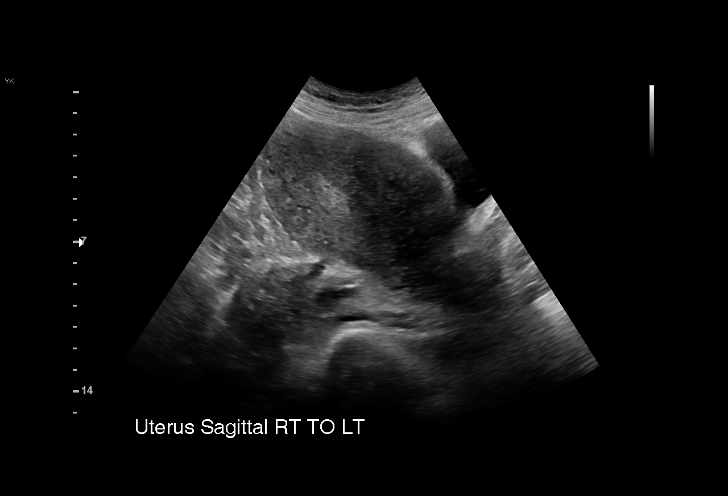
[im 4/37]
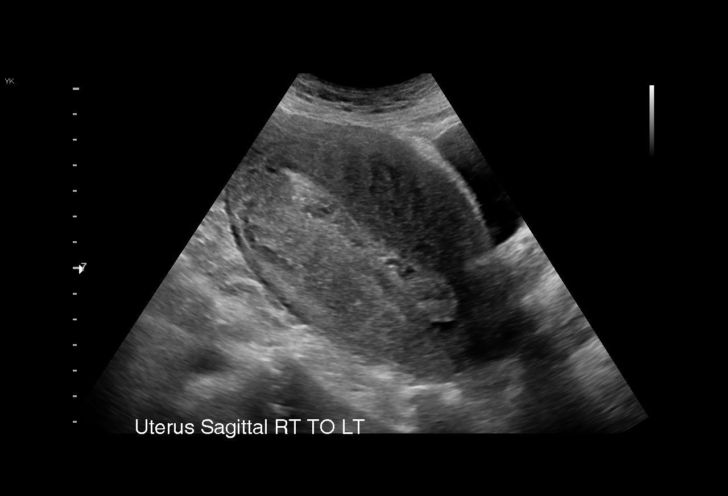
[im 7/37]
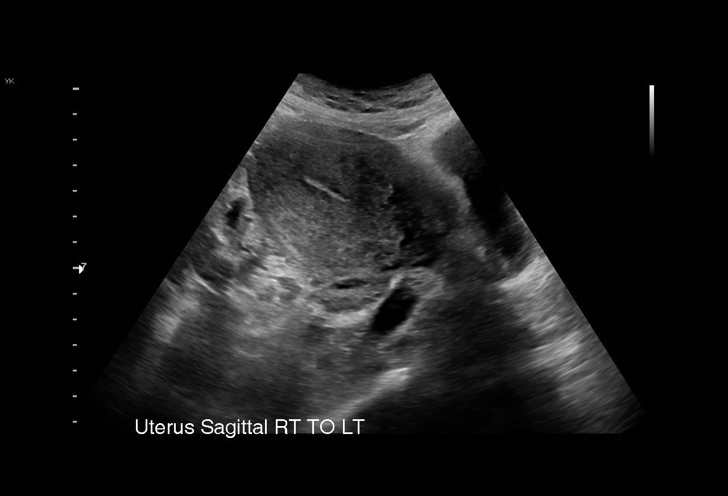
[im 8/37]
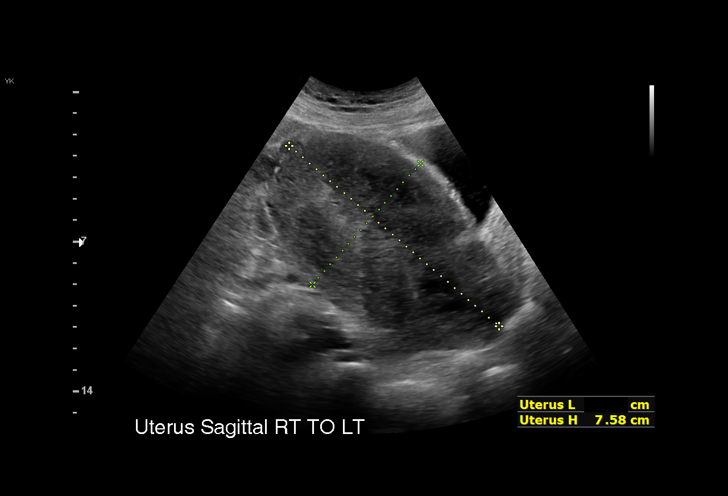
[im 11/37]
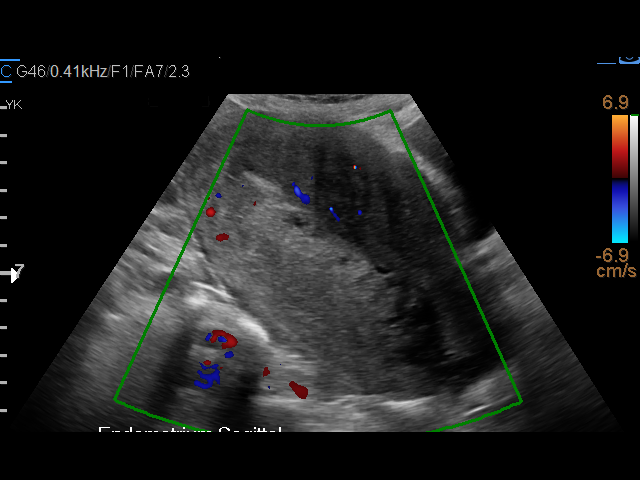
[im 14/37]
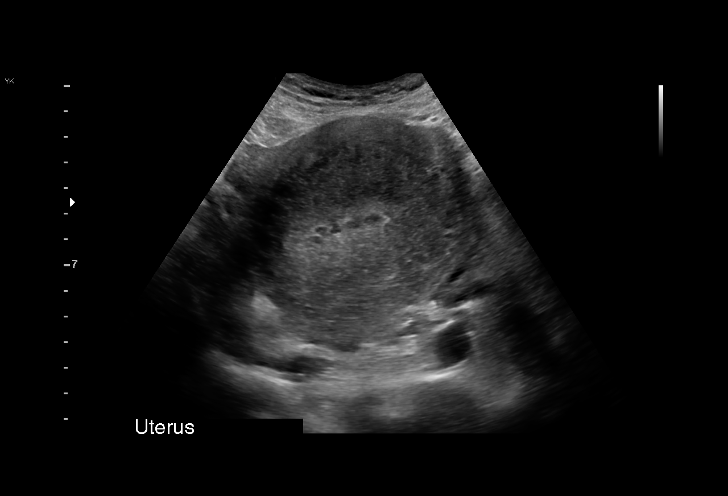
[im 16/37]
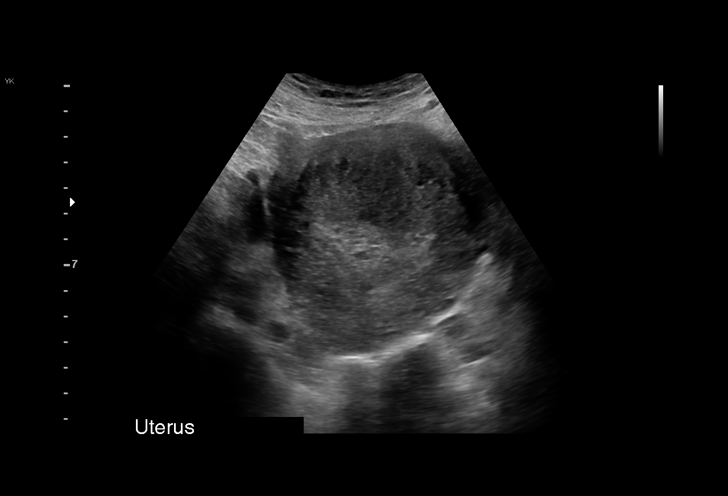
[im 19/37]
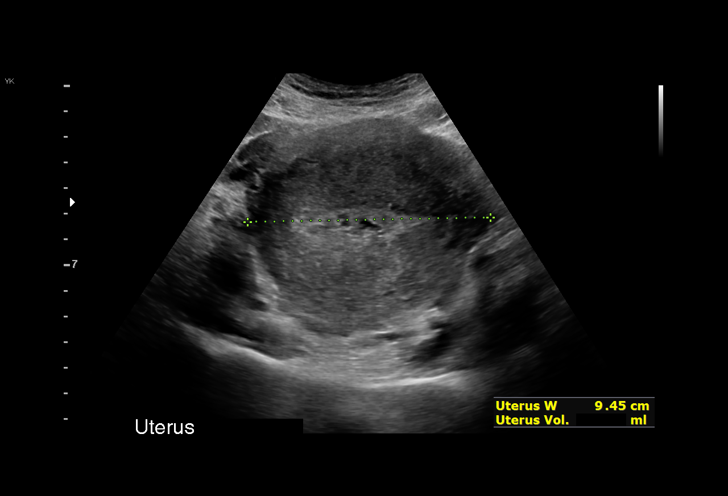
[im 22/37]
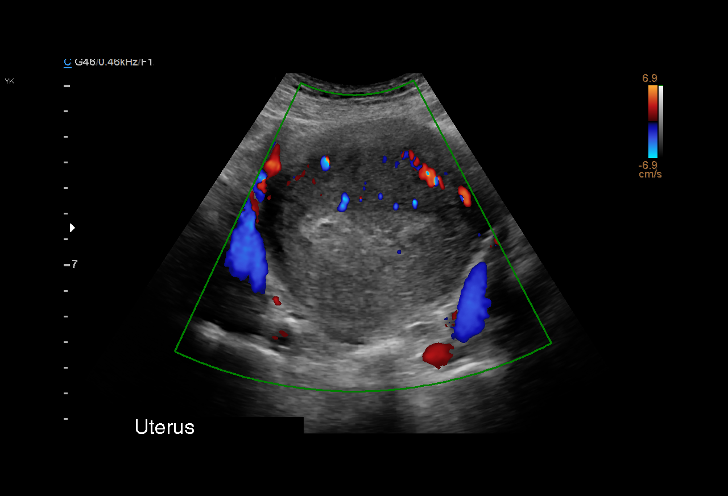
[im 23/37]
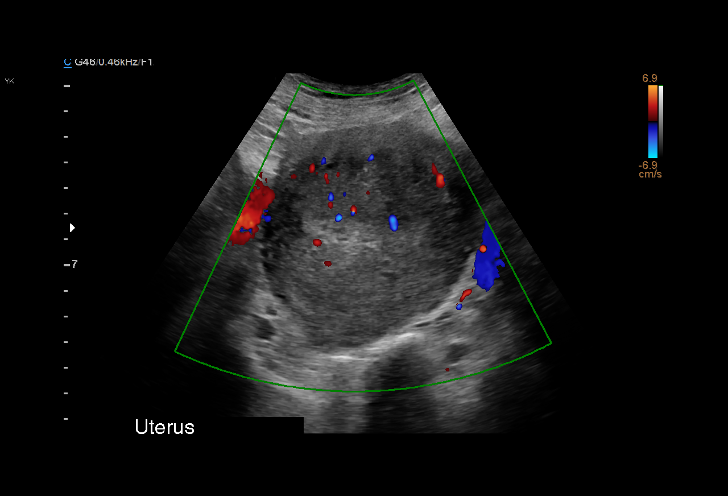
[im 26/37]
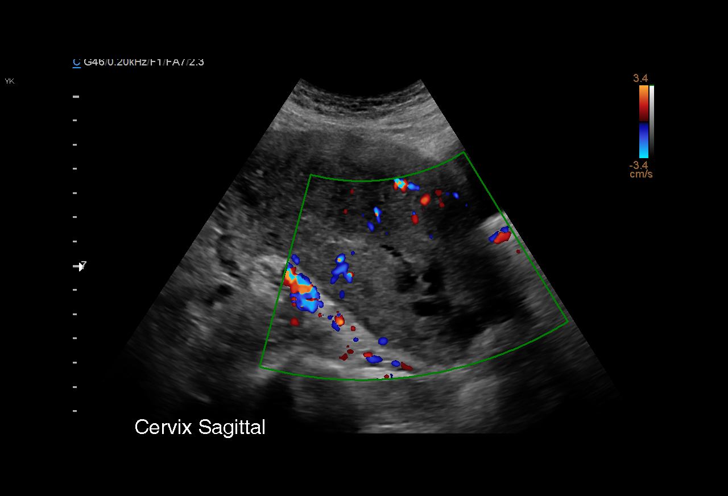
[im 29/37]
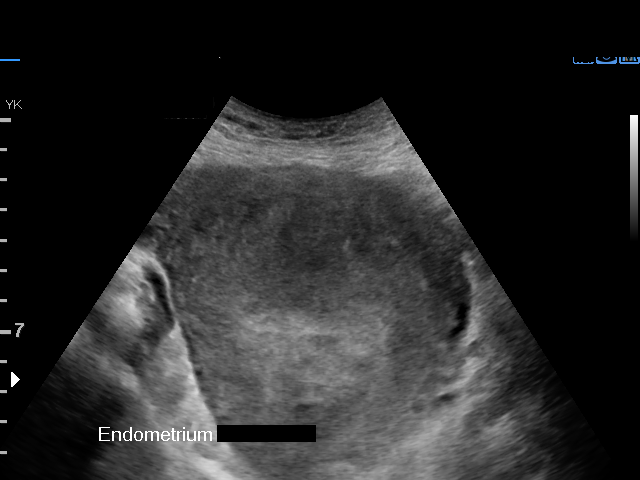
[im 31/37]
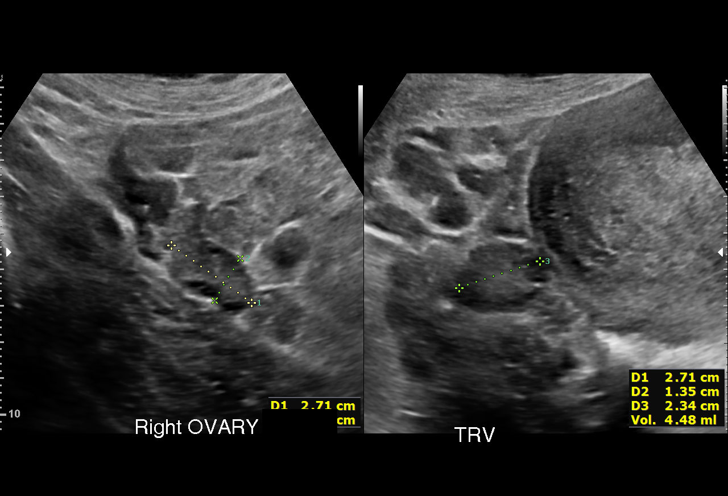
[im 34/37]
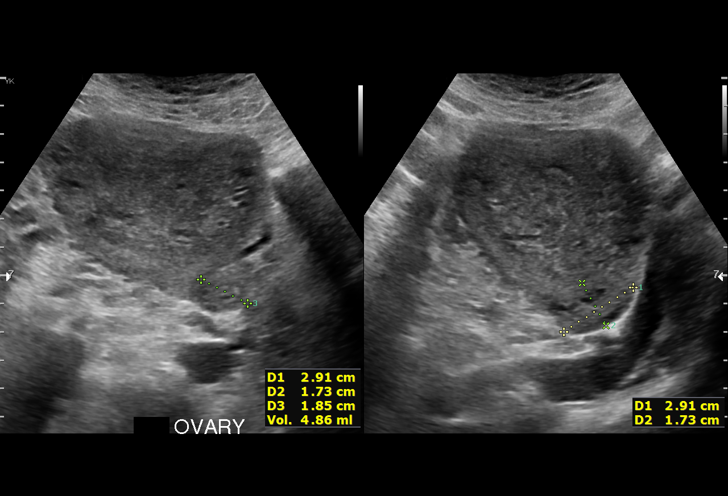
[im 37/37]
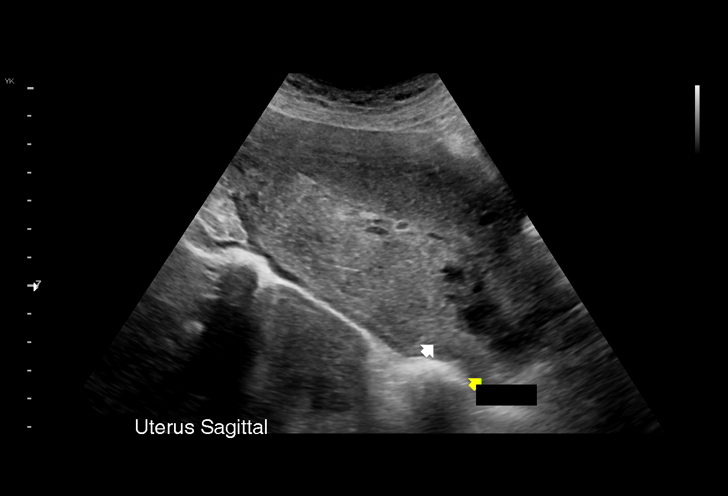

[15 of 25 positions shown; findings below may reference images not displayed]

FINDINGS: Uterus

Measurements: 13.0 x 7.6 x 9.5 cm = volume: 485 mL. No fibroids or
other mass visualized. Cystic change over the cervix likely
nabothian cysts.

Endometrium

Thickness: 12.4 mm. There is mild heterogeneity with small cystic
spaces present likely due to patient's recent postpartum state as
findings could also be seen with endometriosis. No significant
internal vascularity.

Right ovary

Measurements: 2.7 x 1.4 x 2.3 cm = volume: 4.5 mL. Normal
appearance/no adnexal mass. Normal color Doppler.

Left ovary

Measurements: 3.0 x 2.0 x 1.7 cm = volume: 4.9 mL. Normal
appearance/no adnexal mass. Normal color Doppler.

Other findings:  No abnormal free fluid.
IMPRESSION: 1. Endometrium normal in thickness with mild heterogeneity with
small cystic spaces likely due to recent postpartum state.
Endometritis could also have this appearance.

2.  Normal ovaries.  No free fluid.

## 2021-09-21 ENCOUNTER — Encounter: Payer: Self-pay | Admitting: Internal Medicine

## 2021-09-22 MED ORDER — MECLIZINE HCL 25 MG PO TABS: 25.0000 mg | ORAL_TABLET | Freq: Three times a day (TID) | ORAL | 0 refills | Status: DC | PRN

## 2021-10-05 ENCOUNTER — Other Ambulatory Visit: Payer: Self-pay | Admitting: Internal Medicine

## 2021-11-11 ENCOUNTER — Other Ambulatory Visit: Payer: Self-pay | Admitting: Internal Medicine

## 2022-02-06 ENCOUNTER — Other Ambulatory Visit: Payer: Self-pay | Admitting: Internal Medicine

## 2022-02-07 MED ORDER — HYOSCYAMINE SULFATE 0.125 MG SL SUBL: 0.1250 mg | SUBLINGUAL_TABLET | SUBLINGUAL | 3 refills | Status: DC | PRN

## 2022-02-10 ENCOUNTER — Encounter: Payer: Self-pay | Admitting: Internal Medicine

## 2022-02-27 ENCOUNTER — Encounter: Payer: Self-pay | Admitting: Internal Medicine

## 2022-03-01 ENCOUNTER — Ambulatory Visit (INDEPENDENT_AMBULATORY_CARE_PROVIDER_SITE_OTHER): Payer: Commercial Managed Care - PPO | Admitting: Internal Medicine

## 2022-03-01 ENCOUNTER — Encounter: Payer: Self-pay | Admitting: Internal Medicine

## 2022-03-01 DIAGNOSIS — R21 Rash and other nonspecific skin eruption: Secondary | ICD-10-CM | POA: Diagnosis not present

## 2022-03-01 MED ORDER — FLUCONAZOLE 150 MG PO TABS: 150.0000 mg | ORAL_TABLET | ORAL | 1 refills | Status: DC

## 2022-03-01 MED ORDER — KETOCONAZOLE 2 % EX CREA: 1.0000 | TOPICAL_CREAM | Freq: Two times a day (BID) | CUTANEOUS | 1 refills | Status: DC

## 2022-03-01 NOTE — Progress Notes (Signed)
Subjective:    Patient ID: Jenna Leblanc, female    DOB: May 05, 1981, 41 y.o.   MRN: [id number]  HPI Here due to a rash on leg  Has had rash on leg starting about 2 weeks ago--upper right thigh Had evolved with mild palpable abnormality around edges Then developed additional areas on left thigh--towards knee Very itchy Tried lotrimin --didn't help  Does work out in gym--doesn't shower there No one else in family with lesions Current Outpatient Medications on File Prior to Visit  Medication Sig Dispense Refill   hydrOXYzine (ATARAX) 10 MG tablet TAKE 1 TABLET BY MOUTH EVERY DAY AT BEDTIME AS NEEDED 90 tablet 3   hyoscyamine (LEVSIN SL) 0.125 MG SL tablet Place 1 tablet (0.125 mg total) under the tongue every 4 (four) hours as needed. 30 tablet 3   meclizine (ANTIVERT) 25 MG tablet Take 1 tablet (25 mg total) by mouth 3 (three) times daily as needed for dizziness. 30 tablet 0   ondansetron (ZOFRAN-ODT) 4 MG disintegrating tablet TAKE 1 TABLET BY MOUTH EVERY 8 HOURS AS NEEDED FOR NAUSEA AND VOMITING 30 tablet 2   sertraline (ZOLOFT) 25 MG tablet TAKE 1 TABLET (25 MG TOTAL) BY MOUTH DAILY. 90 tablet 3   No current facility-administered medications on file prior to visit.    No Known Allergies  Past Medical History:  Diagnosis Date   Family history of hypertrophic cardiomyopathy 06/07/2016   Gestational diabetes    diet controlled   H/O gestational diabetes in prior pregnancy, currently pregnant 08/21/2019   Early a1c negative   IBS (irritable bowel syndrome)    Diarrhea type   Kidney stones    none in 8 years   Oral herpes simplex infection    Never had genital    Past Surgical History:  Procedure Laterality Date   LITHOTRIPSY     VAGINAL DELIVERY      Family History  Problem Relation Age of Onset   Arrhythmia Father        defibrillator put in   Cancer Sister        sarcoma in the uterus   Cancer Maternal Grandmother        lung   Diabetes Maternal Grandmother     Heart attack Maternal Grandfather    Pneumonia Paternal Grandmother    Diabetes Paternal Grandmother    Cancer Paternal Grandfather        ?spine    Social History   Socioeconomic History   Marital status: Married    Spouse name: Corene Cornea   Number of children: 1   Years of education: Not on file   Highest education level: Not on file  Occupational History   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  Tobacco Use   Smoking status: Former    Types: Cigarettes    Quit date: 07/15/2006    Years since quitting: 15.6   Smokeless tobacco: Never  Vaping Use   Vaping Use: Never used  Substance and Sexual Activity   Alcohol use: Not Currently    Alcohol/week: 0.0 standard drinks of alcohol    Comment: 2-3 times per week   Drug use: No   Sexual activity: Yes    Partners: Male    Birth control/protection: None, Condom  Other Topics Concern   Not on file  Social History Narrative   Not on file   Social Determinants of Health   Financial Resource Strain: Not on file  Food Insecurity: Not on file  Transportation Needs: Not on  file  Physical Activity: Not on file  Stress: Not on file  Social Connections: Not on file  Intimate Partner Violence: Not on file   Review of Systems No fever No recent antibiotic Rx     Objective:   Physical Exam Constitutional:      Appearance: Normal appearance.  Skin:    Comments: ~ 1.5 cm pink circular lesion on upper right thigh--some central clearing Multiple irregular shaped mildly raised lesions on lower left thigh  Neurological:     Mental Status: She is alert.            Assessment & Plan:

## 2022-03-01 NOTE — Assessment & Plan Note (Signed)
Initial lesion looks like ringworm--but not necessarily the others. Not bacterial infection or contact rash No new exposures  Will try empiric Rx with topical ketoconazole and weekly fluconazole If not response, to derm

## 2022-04-18 ENCOUNTER — Encounter: Payer: Self-pay | Admitting: Internal Medicine

## 2022-05-11 ENCOUNTER — Encounter: Payer: Self-pay | Admitting: Internal Medicine

## 2022-05-12 MED ORDER — ONDANSETRON 4 MG PO TBDP: ORAL_TABLET | ORAL | 2 refills | Status: DC

## 2022-05-12 NOTE — Addendum Note (Signed)
Addended by: Pilar Grammes on: 05/12/2022 09:18 AM   Modules accepted: Orders

## 2022-08-07 ENCOUNTER — Encounter: Payer: 59 | Admitting: Internal Medicine

## 2022-08-08 ENCOUNTER — Encounter: Payer: Self-pay | Admitting: Internal Medicine

## 2022-08-08 ENCOUNTER — Ambulatory Visit (INDEPENDENT_AMBULATORY_CARE_PROVIDER_SITE_OTHER): Payer: Commercial Managed Care - PPO | Admitting: Internal Medicine

## 2022-08-08 ENCOUNTER — Encounter: Payer: 59 | Admitting: Internal Medicine

## 2022-08-08 VITALS — BP 110/70 | HR 68 | Temp 97.7°F | Ht 63.0 in | Wt 139.0 lb

## 2022-08-08 DIAGNOSIS — K580 Irritable bowel syndrome with diarrhea: Secondary | ICD-10-CM

## 2022-08-08 DIAGNOSIS — F419 Anxiety disorder, unspecified: Secondary | ICD-10-CM

## 2022-08-08 DIAGNOSIS — Z0000 Encounter for general adult medical examination without abnormal findings: Secondary | ICD-10-CM

## 2022-08-08 LAB — COMPREHENSIVE METABOLIC PANEL
ALT: 14 U/L (ref 0–35)
AST: 14 U/L (ref 0–37)
Albumin: 4.4 g/dL (ref 3.5–5.2)
Alkaline Phosphatase: 42 U/L (ref 39–117)
BUN: 14 mg/dL (ref 6–23)
CO2: 29 mEq/L (ref 19–32)
Calcium: 9.2 mg/dL (ref 8.4–10.5)
Chloride: 102 mEq/L (ref 96–112)
Creatinine, Ser: 0.62 mg/dL (ref 0.40–1.20)
GFR: 110.01 mL/min (ref >60.00)
Glucose, Bld: 125 mg/dL — ABNORMAL HIGH (ref 70–99)
Potassium: 3.7 mEq/L (ref 3.5–5.1)
Sodium: 135 mEq/L (ref 135–145)
Total Bilirubin: 0.4 mg/dL (ref 0.2–1.2)
Total Protein: 7.4 g/dL (ref 6.0–8.3)

## 2022-08-08 LAB — CBC
HCT: 38.4 % (ref 36.0–46.0)
Hemoglobin: 12.8 g/dL (ref 12.0–15.0)
MCHC: 33.2 g/dL (ref 30.0–36.0)
MCV: 89.7 fl (ref 78.0–100.0)
Platelets: 268 10*3/uL (ref 150.0–400.0)
RBC: 4.28 Mil/uL (ref 3.87–5.11)
RDW: 13.3 % (ref 11.5–15.5)
WBC: 6.5 10*3/uL (ref 4.0–10.5)

## 2022-08-08 LAB — LIPID PANEL
Cholesterol: 181 mg/dL (ref 0–200)
HDL: 55.9 mg/dL (ref >39.00)
LDL Cholesterol: 111 mg/dL — ABNORMAL HIGH (ref 0–99)
NonHDL: 125.53
Total CHOL/HDL Ratio: 3
Triglycerides: 74 mg/dL (ref 0.0–149.0)
VLDL: 14.8 mg/dL (ref 0.0–40.0)

## 2022-08-08 LAB — TSH: TSH: 1.73 u[IU]/mL (ref 0.35–5.50)

## 2022-08-08 MED ORDER — HYOSCYAMINE SULFATE 0.125 MG SL SUBL: 0.1250 mg | SUBLINGUAL_TABLET | SUBLINGUAL | 3 refills | Status: AC | PRN

## 2022-08-08 NOTE — Assessment & Plan Note (Addendum)
Healthy Regular exercise Discussed starting mammograms Colon cancer screening at 45 Pap due next year Flu vaccine in the fall and recommended COVID (hasn't had any)

## 2022-08-08 NOTE — Assessment & Plan Note (Signed)
Does well with the low dose sertraline--but has been inconsistent Uses the hyoscyamine prn (like if going out)

## 2022-08-08 NOTE — Assessment & Plan Note (Signed)
Uses hyoscyamine rarely

## 2022-08-08 NOTE — Progress Notes (Signed)
Subjective:    Patient ID: Jenna Leblanc, female    DOB: [birth year], 42 y.o.   MRN: [id number]  HPI Here for physical  Doing okay No new concerns Works out with Physiological scientist and lots of pickle ball (play competitively)  Hasn't seen gyn in some time  Current Outpatient Medications on File Prior to Visit  Medication Sig Dispense Refill   hydrOXYzine (ATARAX) 10 MG tablet TAKE 1 TABLET BY MOUTH EVERY DAY AT BEDTIME AS NEEDED 90 tablet 3   hyoscyamine (LEVSIN SL) 0.125 MG SL tablet Place 1 tablet (0.125 mg total) under the tongue every 4 (four) hours as needed. 30 tablet 3   meclizine (ANTIVERT) 25 MG tablet Take 1 tablet (25 mg total) by mouth 3 (three) times daily as needed for dizziness. 30 tablet 0   ondansetron (ZOFRAN-ODT) 4 MG disintegrating tablet TAKE 1 TABLET BY MOUTH EVERY 8 HOURS AS NEEDED FOR NAUSEA AND VOMITING 30 tablet 2   sertraline (ZOLOFT) 25 MG tablet TAKE 1 TABLET (25 MG TOTAL) BY MOUTH DAILY. 90 tablet 3   No current facility-administered medications on file prior to visit.    No Known Allergies  Past Medical History:  Diagnosis Date   Family history of hypertrophic cardiomyopathy 06/07/2016   Gestational diabetes    diet controlled   H/O gestational diabetes in prior pregnancy, currently pregnant 08/21/2019   Early a1c negative   IBS (irritable bowel syndrome)    Diarrhea type   Kidney stones    none in 8 years   Oral herpes simplex infection    Never had genital    Past Surgical History:  Procedure Laterality Date   LITHOTRIPSY     VAGINAL DELIVERY      Family History  Problem Relation Age of Onset   Arrhythmia Father        defibrillator put in   Cancer Sister        sarcoma in the uterus   Cancer Maternal Grandmother        lung   Diabetes Maternal Grandmother    Heart attack Maternal Grandfather    Pneumonia Paternal Grandmother    Diabetes Paternal Grandmother    Cancer Paternal Grandfather        ?spine    Social History    Socioeconomic History   Marital status: Married    Spouse name: Corene Cornea   Number of children: 2   Years of education: Not on file   Highest education level: Not on file  Occupational History   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  Tobacco Use   Smoking status: Former    Types: Cigarettes    Quit date: 07/15/2006    Years since quitting: 16.0   Smokeless tobacco: Never  Vaping Use   Vaping Use: Never used  Substance and Sexual Activity   Alcohol use: Not Currently    Alcohol/week: 0.0 standard drinks of alcohol    Comment: 2-3 times per week   Drug use: No   Sexual activity: Yes    Partners: Male    Birth control/protection: None, Condom  Other Topics Concern   Not on file  Social History Narrative   Not on file   Social Determinants of Health   Financial Resource Strain: Not on file  Food Insecurity: Not on file  Transportation Needs: Not on file  Physical Activity: Not on file  Stress: Not on file  Social Connections: Not on file  Intimate Partner Violence: Not on file   Review  of Systems  Constitutional:  Negative for fatigue and unexpected weight change.       Wears seat belt  HENT:  Positive for hearing loss. Negative for dental problem and tinnitus.   Eyes:  Negative for visual disturbance.       No diplopia or unilateral vision loss  Respiratory:  Negative for shortness of breath.        Resolving cough from respiratory infection Tight in chest with this---improving  Cardiovascular:  Negative for chest pain, palpitations and leg swelling.  Gastrointestinal:  Negative for blood in stool and constipation.       No heartburn  Genitourinary:  Negative for dyspareunia, dysuria and hematuria.       Some stress incontinence Vasectomy for birth control Periods are regular--heavy at times  Musculoskeletal:  Negative for arthralgias, back pain and joint swelling.  Skin:  Negative for rash.  Allergic/Immunologic: Positive for environmental allergies. Negative for  immunocompromised state.       Uses zyrtec prn  Neurological:  Negative for dizziness, syncope, light-headedness and headaches.  Hematological:  Negative for adenopathy. Bruises/bleeds easily.       With bruising--gets rash after (wasn't ringworm in the past)  Psychiatric/Behavioral:  Negative for dysphoric mood and sleep disturbance. The patient is not nervous/anxious.        Objective:   Physical Exam Constitutional:      Appearance: Normal appearance.  HENT:     Mouth/Throat:     Pharynx: No oropharyngeal exudate or posterior oropharyngeal erythema.  Eyes:     Conjunctiva/sclera: Conjunctivae normal.     Pupils: Pupils are equal, round, and reactive to light.  Cardiovascular:     Rate and Rhythm: Normal rate and regular rhythm.     Pulses: Normal pulses.     Heart sounds: No murmur heard.    No gallop.  Pulmonary:     Effort: Pulmonary effort is normal.     Breath sounds: Normal breath sounds. No wheezing or rales.  Abdominal:     Palpations: Abdomen is soft.     Tenderness: There is no abdominal tenderness.  Musculoskeletal:     Cervical back: Neck supple.     Right lower leg: No edema.     Left lower leg: No edema.  Lymphadenopathy:     Cervical: No cervical adenopathy.  Skin:    Findings: No rash.  Neurological:     General: No focal deficit present.     Mental Status: She is alert and oriented to person, place, and time.  Psychiatric:        Mood and Affect: Mood normal.        Behavior: Behavior normal.            Assessment & Plan:

## 2022-08-09 ENCOUNTER — Encounter: Payer: Self-pay | Admitting: Internal Medicine

## 2022-09-08 ENCOUNTER — Encounter: Payer: Self-pay | Admitting: Internal Medicine

## 2022-09-16 ENCOUNTER — Other Ambulatory Visit: Payer: Self-pay | Admitting: Internal Medicine

## 2022-09-18 NOTE — Telephone Encounter (Signed)
LAST APPOINTMENT DATE: 08/08/2022   NEXT APPOINTMENT DATE: Visit date not found    LAST REFILL: 09/05/2021  QTY: #90 w/ 3 refills

## 2022-09-30 ENCOUNTER — Other Ambulatory Visit: Payer: Self-pay | Admitting: Internal Medicine

## 2023-05-10 ENCOUNTER — Encounter: Payer: Self-pay | Admitting: Internal Medicine

## 2023-05-11 ENCOUNTER — Other Ambulatory Visit: Payer: Self-pay | Admitting: Internal Medicine

## 2023-05-11 DIAGNOSIS — Z1231 Encounter for screening mammogram for malignant neoplasm of breast: Secondary | ICD-10-CM

## 2023-05-25 ENCOUNTER — Ambulatory Visit
Admission: RE | Admit: 2023-05-25 | Discharge: 2023-05-25 | Disposition: A | Discharge status: Home / Self Care | Payer: Commercial Managed Care - PPO | Source: Ambulatory Visit | Attending: Internal Medicine | Admitting: Internal Medicine

## 2023-05-25 DIAGNOSIS — Z1231 Encounter for screening mammogram for malignant neoplasm of breast: Secondary | ICD-10-CM

## 2023-05-29 ENCOUNTER — Encounter: Payer: Self-pay | Admitting: Internal Medicine

## 2023-07-13 ENCOUNTER — Other Ambulatory Visit: Payer: Self-pay | Admitting: Internal Medicine

## 2023-10-23 ENCOUNTER — Encounter: Payer: Self-pay | Admitting: Internal Medicine

## 2023-11-21 ENCOUNTER — Encounter: Payer: Self-pay | Admitting: Internal Medicine
